# Patient Record
Sex: Female | Born: 1955
Health system: Southern US, Community
[De-identification: ages and names within clinical notes are randomized; demographics above are authoritative.]

## PROBLEM LIST (undated history)

## (undated) DIAGNOSIS — K219 Gastro-esophageal reflux disease without esophagitis: Secondary | ICD-10-CM

## (undated) DIAGNOSIS — M329 Systemic lupus erythematosus, unspecified: Secondary | ICD-10-CM

## (undated) DIAGNOSIS — G5600 Carpal tunnel syndrome, unspecified upper limb: Secondary | ICD-10-CM

## (undated) DIAGNOSIS — F419 Anxiety disorder, unspecified: Secondary | ICD-10-CM

## (undated) DIAGNOSIS — Z9889 Other specified postprocedural states: Secondary | ICD-10-CM

## (undated) DIAGNOSIS — R42 Dizziness and giddiness: Secondary | ICD-10-CM

## (undated) DIAGNOSIS — D649 Anemia, unspecified: Secondary | ICD-10-CM

## (undated) HISTORY — PX: OTHER SURGICAL HISTORY: SHX169

## (undated) HISTORY — PX: CHOLECYSTECTOMY: SHX55

## (undated) HISTORY — PX: ROTATOR CUFF REPAIR: SHX139

## (undated) HISTORY — PX: KNEE SURGERY: SHX244

## (undated) HISTORY — DX: Other specified postprocedural states: Z98.890

## (undated) HISTORY — PX: NISSEN FUNDOPLICATION: SHX2091

## (undated) HISTORY — DX: Gastro-esophageal reflux disease without esophagitis: K21.9

## (undated) HISTORY — PX: TONSILLECTOMY: SUR1361

## (undated) HISTORY — DX: Dizziness and giddiness: R42

## (undated) HISTORY — PX: NOSE SURGERY: SHX723

## (undated) HISTORY — DX: Carpal tunnel syndrome, unspecified upper limb: G56.00

## (undated) HISTORY — PX: ADENOIDECTOMY: SUR15

## (undated) HISTORY — DX: Anemia, unspecified: D64.9

---
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
## 1998-12-03 ENCOUNTER — Encounter: Payer: Self-pay | Admitting: Internal Medicine

## 1998-12-03 ENCOUNTER — Encounter: Admission: RE | Admit: 1998-12-03 | Discharge: 1998-12-03 | Payer: Self-pay | Admitting: Internal Medicine

## 1999-02-18 ENCOUNTER — Ambulatory Visit (HOSPITAL_BASED_OUTPATIENT_CLINIC_OR_DEPARTMENT_OTHER): Admission: RE | Admit: 1999-02-18 | Discharge: 1999-02-18 | Payer: Self-pay | Admitting: Orthopaedic Surgery

## 1999-06-25 ENCOUNTER — Encounter: Payer: Self-pay | Admitting: Internal Medicine

## 1999-06-25 ENCOUNTER — Encounter: Admission: RE | Admit: 1999-06-25 | Discharge: 1999-06-25 | Payer: Self-pay | Admitting: Internal Medicine

## 1999-10-22 ENCOUNTER — Encounter: Admission: RE | Admit: 1999-10-22 | Discharge: 1999-10-22 | Payer: Self-pay | Admitting: Family Medicine

## 1999-10-22 ENCOUNTER — Encounter: Payer: Self-pay | Admitting: Internal Medicine

## 1999-10-29 ENCOUNTER — Encounter (INDEPENDENT_AMBULATORY_CARE_PROVIDER_SITE_OTHER): Payer: Self-pay | Admitting: Specialist

## 1999-10-29 ENCOUNTER — Ambulatory Visit (HOSPITAL_COMMUNITY): Admission: RE | Admit: 1999-10-29 | Discharge: 1999-10-29 | Payer: Self-pay | Admitting: Gastroenterology

## 2000-03-16 ENCOUNTER — Encounter: Payer: Self-pay | Admitting: Surgery

## 2000-03-17 ENCOUNTER — Inpatient Hospital Stay (HOSPITAL_COMMUNITY): Admission: EM | Admit: 2000-03-17 | Discharge: 2000-03-18 | Payer: Self-pay | Admitting: Surgery

## 2001-08-12 ENCOUNTER — Ambulatory Visit (HOSPITAL_COMMUNITY): Admission: RE | Admit: 2001-08-12 | Discharge: 2001-08-12 | Payer: Self-pay | Admitting: Surgery

## 2001-08-12 ENCOUNTER — Encounter: Payer: Self-pay | Admitting: Surgery

## 2001-10-20 ENCOUNTER — Ambulatory Visit (HOSPITAL_COMMUNITY): Admission: RE | Admit: 2001-10-20 | Discharge: 2001-10-20 | Payer: Self-pay | Admitting: Gastroenterology

## 2002-03-22 ENCOUNTER — Emergency Department (HOSPITAL_COMMUNITY): Admission: EM | Admit: 2002-03-22 | Discharge: 2002-03-23 | Payer: Self-pay | Admitting: *Deleted

## 2002-03-22 ENCOUNTER — Encounter: Payer: Self-pay | Admitting: *Deleted

## 2002-03-23 ENCOUNTER — Encounter: Payer: Self-pay | Admitting: *Deleted

## 2002-05-25 ENCOUNTER — Encounter: Admission: RE | Admit: 2002-05-25 | Discharge: 2002-08-23 | Payer: Self-pay | Admitting: Internal Medicine

## 2002-06-05 ENCOUNTER — Encounter: Admission: RE | Admit: 2002-06-05 | Discharge: 2002-06-05 | Payer: Self-pay | Admitting: Internal Medicine

## 2002-06-05 ENCOUNTER — Encounter: Payer: Self-pay | Admitting: Internal Medicine

## 2003-04-24 ENCOUNTER — Ambulatory Visit (HOSPITAL_COMMUNITY): Admission: RE | Admit: 2003-04-24 | Discharge: 2003-04-24 | Payer: Self-pay | Admitting: Otolaryngology

## 2003-04-30 ENCOUNTER — Encounter: Admission: RE | Admit: 2003-04-30 | Discharge: 2003-04-30 | Payer: Self-pay | Admitting: Otolaryngology

## 2004-03-17 ENCOUNTER — Encounter: Admission: RE | Admit: 2004-03-17 | Discharge: 2004-03-17 | Payer: Self-pay | Admitting: Internal Medicine

## 2004-05-28 ENCOUNTER — Other Ambulatory Visit: Admission: RE | Admit: 2004-05-28 | Discharge: 2004-05-28 | Payer: Self-pay | Admitting: Internal Medicine

## 2006-07-06 ENCOUNTER — Encounter: Admission: RE | Admit: 2006-07-06 | Discharge: 2006-07-06 | Payer: Self-pay | Admitting: Family Medicine

## 2006-08-27 ENCOUNTER — Other Ambulatory Visit: Admission: RE | Admit: 2006-08-27 | Discharge: 2006-08-27 | Payer: Self-pay | Admitting: Family Medicine

## 2006-09-14 ENCOUNTER — Encounter: Admission: RE | Admit: 2006-09-14 | Discharge: 2006-09-14 | Payer: Self-pay | Admitting: Family Medicine

## 2006-09-17 ENCOUNTER — Encounter: Admission: RE | Admit: 2006-09-17 | Discharge: 2006-09-17 | Payer: Self-pay | Admitting: Family Medicine

## 2006-09-29 ENCOUNTER — Encounter: Admission: RE | Admit: 2006-09-29 | Discharge: 2006-09-29 | Payer: Self-pay | Admitting: Family Medicine

## 2006-10-14 ENCOUNTER — Encounter: Admission: RE | Admit: 2006-10-14 | Discharge: 2006-10-14 | Payer: Self-pay | Admitting: Family Medicine

## 2007-02-03 HISTORY — PX: OTHER SURGICAL HISTORY: SHX169

## 2007-03-30 ENCOUNTER — Encounter: Admission: RE | Admit: 2007-03-30 | Discharge: 2007-03-30 | Payer: Self-pay | Admitting: Family Medicine

## 2007-08-25 ENCOUNTER — Encounter (INDEPENDENT_AMBULATORY_CARE_PROVIDER_SITE_OTHER): Payer: Self-pay | Admitting: General Surgery

## 2007-08-25 ENCOUNTER — Ambulatory Visit (HOSPITAL_COMMUNITY): Admission: RE | Admit: 2007-08-25 | Discharge: 2007-08-25 | Payer: Self-pay | Admitting: General Surgery

## 2007-10-04 DIAGNOSIS — Z9889 Other specified postprocedural states: Secondary | ICD-10-CM

## 2007-10-04 HISTORY — DX: Other specified postprocedural states: Z98.890

## 2007-10-25 ENCOUNTER — Encounter (HOSPITAL_COMMUNITY): Admission: RE | Admit: 2007-10-25 | Discharge: 2008-01-23 | Payer: Self-pay | Admitting: Gastroenterology

## 2007-11-03 HISTORY — PX: GIVENS CAPSULE STUDY: SHX5432

## 2007-12-28 ENCOUNTER — Encounter: Admission: RE | Admit: 2007-12-28 | Discharge: 2007-12-28 | Payer: Self-pay | Admitting: Family Medicine

## 2008-02-16 ENCOUNTER — Ambulatory Visit (HOSPITAL_COMMUNITY): Admission: RE | Admit: 2008-02-16 | Discharge: 2008-02-16 | Payer: Self-pay | Admitting: General Surgery

## 2008-02-16 ENCOUNTER — Encounter (INDEPENDENT_AMBULATORY_CARE_PROVIDER_SITE_OTHER): Payer: Self-pay | Admitting: General Surgery

## 2008-04-24 ENCOUNTER — Encounter: Admission: RE | Admit: 2008-04-24 | Discharge: 2008-04-24 | Payer: Self-pay | Admitting: Family Medicine

## 2008-11-12 ENCOUNTER — Encounter: Admission: RE | Admit: 2008-11-12 | Discharge: 2008-11-12 | Payer: Self-pay | Admitting: Orthopedic Surgery

## 2009-02-02 ENCOUNTER — Emergency Department (HOSPITAL_COMMUNITY): Admission: EM | Admit: 2009-02-02 | Discharge: 2009-02-02 | Payer: Self-pay | Admitting: Emergency Medicine

## 2009-02-12 ENCOUNTER — Ambulatory Visit (HOSPITAL_BASED_OUTPATIENT_CLINIC_OR_DEPARTMENT_OTHER): Admission: RE | Admit: 2009-02-12 | Discharge: 2009-02-12 | Payer: Self-pay | Admitting: Orthopedic Surgery

## 2009-03-06 ENCOUNTER — Ambulatory Visit (HOSPITAL_BASED_OUTPATIENT_CLINIC_OR_DEPARTMENT_OTHER): Admission: RE | Admit: 2009-03-06 | Discharge: 2009-03-06 | Payer: Self-pay | Admitting: Orthopedic Surgery

## 2009-03-06 ENCOUNTER — Ambulatory Visit (HOSPITAL_COMMUNITY): Admission: RE | Admit: 2009-03-06 | Discharge: 2009-03-06 | Payer: Self-pay | Admitting: Family Medicine

## 2010-02-02 DIAGNOSIS — M329 Systemic lupus erythematosus, unspecified: Secondary | ICD-10-CM

## 2010-02-02 DIAGNOSIS — IMO0002 Reserved for concepts with insufficient information to code with codable children: Secondary | ICD-10-CM

## 2010-02-02 HISTORY — DX: Reserved for concepts with insufficient information to code with codable children: IMO0002

## 2010-02-02 HISTORY — DX: Systemic lupus erythematosus, unspecified: M32.9

## 2010-02-23 ENCOUNTER — Encounter: Payer: Self-pay | Admitting: Family Medicine

## 2010-02-23 ENCOUNTER — Encounter: Payer: Self-pay | Admitting: Otolaryngology

## 2010-04-20 LAB — BASIC METABOLIC PANEL
BUN: 9 mg/dL (ref 6–23)
CO2: 22 mEq/L (ref 19–32)
Calcium: 8.7 mg/dL (ref 8.4–10.5)
Chloride: 102 mEq/L (ref 96–112)
Creatinine, Ser: 0.5 mg/dL (ref 0.4–1.2)
GFR calc Af Amer: >60 mL/min (ref >60)
GFR calc non Af Amer: >60 mL/min (ref >60)
Glucose, Bld: 118 mg/dL — ABNORMAL HIGH (ref 70–99)
Potassium: 3.5 mEq/L (ref 3.5–5.1)
Sodium: 139 mEq/L (ref 135–145)

## 2010-04-20 LAB — POCT HEMOGLOBIN-HEMACUE: Hemoglobin: 13.3 g/dL (ref 12.0–15.0)

## 2010-05-19 LAB — COMPREHENSIVE METABOLIC PANEL
ALT: 28 U/L (ref 0–35)
AST: 23 U/L (ref 0–37)
Albumin: 3.7 g/dL (ref 3.5–5.2)
Alkaline Phosphatase: 55 U/L (ref 39–117)
BUN: 7 mg/dL (ref 6–23)
CO2: 26 mEq/L (ref 19–32)
Calcium: 9.7 mg/dL (ref 8.4–10.5)
Chloride: 106 mEq/L (ref 96–112)
Creatinine, Ser: 0.6 mg/dL (ref 0.4–1.2)
GFR calc Af Amer: >60 mL/min (ref >60)
GFR calc non Af Amer: >60 mL/min (ref >60)
Glucose, Bld: 96 mg/dL (ref 70–99)
Potassium: 4.2 mEq/L (ref 3.5–5.1)
Sodium: 139 mEq/L (ref 135–145)
Total Bilirubin: 0.5 mg/dL (ref 0.3–1.2)
Total Protein: 6.7 g/dL (ref 6.0–8.3)

## 2010-05-19 LAB — CBC
HCT: 40.3 % (ref 36.0–46.0)
Hemoglobin: 13.4 g/dL (ref 12.0–15.0)
MCHC: 33.2 g/dL (ref 30.0–36.0)
MCV: 89.9 fL (ref 78.0–100.0)
Platelets: 243 10*3/uL (ref 150–400)
RBC: 4.48 MIL/uL (ref 3.87–5.11)
RDW: 15.2 % (ref 11.5–15.5)
WBC: 6.6 10*3/uL (ref 4.0–10.5)

## 2010-05-19 LAB — DIFFERENTIAL
Basophils Absolute: 0 10*3/uL (ref 0.0–0.1)
Basophils Relative: 1 % (ref 0–1)
Eosinophils Absolute: 0.1 10*3/uL (ref 0.0–0.7)
Eosinophils Relative: 2 % (ref 0–5)
Lymphocytes Relative: 23 % (ref 12–46)
Lymphs Abs: 1.5 10*3/uL (ref 0.7–4.0)
Monocytes Absolute: 0.5 10*3/uL (ref 0.1–1.0)
Monocytes Relative: 7 % (ref 3–12)
Neutro Abs: 4.4 10*3/uL (ref 1.7–7.7)
Neutrophils Relative %: 67 % (ref 43–77)

## 2010-06-17 NOTE — Op Note (Signed)
NAMESHALA, BAUMBACH           ACCOUNT NO.:  [id number]   MEDICAL RECORD NO.:  [id number]          PATIENT TYPE:  AMB   LOCATION:  DAY                          FACILITY:  [facility]   PHYSICIAN:  Adolph Pollack, M.D.DATE OF BIRTH:  1955-04-30   DATE OF PROCEDURE:  08/25/2007  DATE OF DISCHARGE:                               OPERATIVE REPORT   PREOPERATIVE DIAGNOSIS:  Right sided headaches.   POSTOPERATIVE DIAGNOSIS:  Right sided headaches.   PROCEDURE:  Right superficial temporal artery biopsy.   SURGEON:  Adolph Pollack, M.D.   ANESTHESIA:  Local (lidocaine Marcaine mix) with MAC.   INDICATIONS:  This is a 55 year old female seen with some right-sided  headaches and visual changes.  There is a concern for temporal  arteritis.  She then placed on steroids with slight relief of symptoms.  She now presents for the above procedure.   TECHNIQUE:  She is seen in the holding area and the right forehead  marked with my initials.  She was then brought to the operating room,  placed supine on the operating table, given intravenous sedation.  The  hair around the right preauricular area up to the right forehead was  clipped and the area sterilely prepped and draped.  The artery was  palpable in the right preauricular area.  I anesthetized the skin in  that area beginning in the right preauricular area superiorly and then  heading toward the forehead.  I then made a skin incision through skin  and dermis.  Using careful dissection, I then identified the pulsating  right superficial temporal artery.  I used careful blunt dissection to  isolate it as well as one of its branches.  I then ligated it in three  areas proximally and distally as well as the distal portion of the  branch.  I then divided it sharply and sent the specimen off the field  to pathology for permanent section.   Following this I inspected the wound.  Hemostasis was adequate.  I then  closed the skin with a  running 4-0 Monocryl subcuticular stitch.  Dermabond and Steri-Strips applied.   She tolerated the procedure well without any apparent complications was  taken to recovery in satisfactory condition.      Adolph Pollack, M.D.  Electronically Signed     TJR/MEDQ  D:  08/25/2007  T:  08/25/2007  Job:  [id number]   cc:   Pramod P. Pearlean Brownie, MD  Fax: [phone]   Quita Skye. Artis Flock, M.D.  Fax: [phone]

## 2010-06-17 NOTE — Op Note (Signed)
[name], [name]           ACCOUNT NO.:  [id number]   MEDICAL RECORD NO.:  [id number]          PATIENT TYPE:  AMB   LOCATION:  SDS                          FACILITY:  [facility]   PHYSICIAN:  Adolph Pollack, M.D.DATE OF BIRTH:  [birth year]   DATE OF PROCEDURE:  02/16/2008  DATE OF DISCHARGE:  02/16/2008                               OPERATIVE REPORT   PREOPERATIVE DIAGNOSES:  1. Chronic cholecystitis.  2. Cholelithiasis.   POSTOPERATIVE DIAGNOSES:  1. Chronic cholecystitis.  2. Cholelithiasis.   PROCEDURE:  Laparoscopic cholecystectomy with intraoperative  cholangiogram.   SURGEON:  Adolph Pollack, MD   ASSISTANT:  Velora Heckler, MD   ANESTHESIA:  General.   INDICATIONS:  This 55 year old female had an acute episode of biliary  colic on December 28, 2007.  She was seen at Urgent Care and was  empirically given some ciprofloxacin.  She underwent radiologic study  demonstrating cholelithiasis but no cholecystitis.  She has been  relatively asymptomatic since that time.  She now presents for elective  cholecystectomy.  We have discussed the procedure, risks, and aftercare  preoperatively.   TECHNIQUE:  She was brought to the operating room, placed supine on the  operating table, and general anesthetic was administered.  The abdominal  wall was sterilely prepped and draped.  She had a previous Nissen  fundoplication by our laparoscopic approach.  Thus, a previous  supraumbilical incision was re-incised after infiltration of Marcaine  with a small incision made in the fascia and the peritoneal cavity  entered under direct vision.  A pursestring suture of 0 Vicryl was  placed around the fascial edges.  A Hasson trocar was introduced into  the peritoneal cavity and pneumoperitoneum created by insufflation of  CO2 gas.   Next, she was placed in reverse Trendelenburg position with right side  tilted slightly up.  An 11-mm trocar was then placed into the epigastric  region and two 5-mm trocars placed in the right upper quadrant.  The  fundus of the gallbladder was grasped and there noted to be omental  adhesions from the fundus all the way down to the infundibulum.  It was  taken down with electrocautery.  The fundus of the gallbladder was then  retracted toward the right shoulder.  The infundibulum was grasped and  retracted laterally.  Using blunt dissection, I mobilized the  infundibulum and identified the cystic duct and created a window around  it.  A clip was placed at the cystic duct and gallbladder junction and a  small incision made in the cystic duct.  A cholangiocath was passed into  the anterior abdominal wall, placed into the cystic duct, and a  cholangiogram was performed.   Using real-time fluoroscopy, dilute contrast was injected into the  cystic duct, which was of moderate length.  The common hepatic, right  and left hepatic, common bile ducts all filled promptly.  Contrast  drained promptly into the duodenum without obvious evidence of  obstruction.   Cholangiocatheter was removed, cystic duct was clipped 3 times  proximally and divided sharply.   I then identified both the anterior and posterior  branch of the cystic  artery.  Windows were created around these, they were clipped and  divided.  I divided the gallbladder and was dissected from the liver  intact using electrocautery and placed into an Endopouch bag.   I then irrigated out the gallbladder fossa.  Controlled bleeding points  at the gallbladder fossa with electrocautery.  There was also a small  amount of bleeding from the omentum that had been dissected free from  the gallbladder, and I controlled this with electrocautery.  Further  inspection demonstrated no further bleeding and the irrigation fluid was  evacuated.  Surgicel was then placed in the gallbladder fossa.   The gallbladder was removed through the subumbilical incision and the  subumbilical fascial  defect closed by tightening up and tying down the  pursestring suture.  The remaining trocars were removed, and  pneumoperitoneum was released.  The skin incisions were closed with 4-0  Monocryl subcuticular stitches followed by Steri-Strips and sterile  dressings.  She tolerated the procedure well without any apparent  complications, and she was taken to the recovery in satisfactory  condition.      Adolph Pollack, M.D.  Electronically Signed     TJR/MEDQ  D:  02/16/2008  T:  02/17/2008  Job:  [id number]   cc:   Jonita Albee, M.D.  Quita Skye Artis Flock, M.D.

## 2010-06-20 NOTE — Discharge Summary (Signed)
[facility]  Patient:    [name], [name]                  MRN: [id number] Adm. Date:  [id number] Attending:  Katha Cabal CC:         Darius Bump, M.D.  Charolett Bumpers III, M.D.   Discharge Summary  CCS NUMBER:  [id number]  ADMISSION DIAGNOSIS:  Refractory gastroesophageal reflux disease.  HOSPITAL COURSE:  [name] is a 55 year old lady who has had a time with gastroesophageal reflux disease.  She was taken to the operating room on March 16, 2000, and underwent a laparoscopic Nissen fundoplication (three) with closure of the hiatus (two) - one figure-of-eight suture and one simple suture and tacking of the wrap to the diaphragm.  Postoperatively she did well and was begun on liquids and advanced to a full liquid diet prior to discharge.  She was given Mepergan Fortis to take for pain if needed upon discharge.  She will followed up in the office in two to three weeks.  FINAL DIAGNOSIS:  Gastroesophageal reflux disease, status post laparoscopic Nissen fundoplication. DD:  03/18/00 TD:  03/18/00 Job: [id number]

## 2010-06-20 NOTE — Procedures (Signed)
[facility]  Patient:    [name], [name]                    MRN: [id number] Proc. Date: 10/29/99 Attending:  Verlin Grills, M.D.                           Procedure Report  PROCEDURE:  Esophagogastroduodenoscopy.  ENDOSCOPIST:  Verlin Grills, M.D.  INDICATIONS FOR PROCEDURE:  The patient is a 55 year old female with chronic gastroesophageal reflux disease who has controlled her heartburn with chronic proton pump inhibitor therapy.  She recently underwent an upper GI x-ray series which revealed spontaneous gastroesophageal reflux with an otherwise normal-appearing esophagus, sliding hiatal hernia, and a non-distensible narrowed gastric antrum.  Esophagogastroduodenoscopy is performed to evaluate the gastric antrum.  I discussed with the patient the complications associated with esophagogastroduodenoscopy including intestinal bleeding and intestinal perforation.  The patient has signed the operative permit.  PREMEDICATION:  Versed 7 mg and Demerol 50 mg.  ENDOSCOPE:  Olympus gastroscope.  DESCRIPTION OF PROCEDURE:  After obtaining informed consent, the patient was placed in the left lateral decubitus position.  I administered intravenous Demerol and intravenous Versed to achieve conscious sedation for the procedure.  The patients blood pressure, oxygen saturation, and cardiac rhythm were monitored throughout the procedure and documented in the medical record.  The Olympus gastroscope was passed through the posterior hypopharynx into the proximal esophagus without difficulty.  The hypopharynx, larynx, and vocal cords appeared normal.  Esophagoscopy:  The proximal, mid, and lower segments of the esophagus appeared normal.  The squamocolumnar junction and the esophagogastric junction are noted at 30 cm from the incisor teeth.  Endoscopically, there is no evidence of erosive esophagitis, Barretts esophagus, or esophageal  ulcers. There is no evidence of mucosal scarring of the esophagus.  Gastroscopy:  There is a moderate-sized hiatal hernia.  Retroflexed view of the gastric cardia and fundus was normal.  The gastric body appeared normal. Mucosa and the gastric antrum appeared normal.  The pylorus was patent.  There is circumferential narrowing of the distal gastric antrum and joins the pylorus.  The mucosa is intact.  There is no ulceration or otherwise abnormal endoscopic appearance.  Multiple biopsies were taken from the distal gastric antrum for pathologic evaluation to rule out H. pylori antral gastritis.  Duodenoscopy:  The duodenal bulb, mid duodenum, and distal duodenum appeared normal.  ASSESSMENT: 1. Gastroesophageal reflux disease associated with a moderate-sized hiatal    hernia, but otherwise normal-appearing esophagus. 2. Circumferential narrowing of the distal gastric antrum with normal    overlying mucosa with biopsies pending.  RECOMMENDATIONS:  Continue Nexium to control gastroesophageal reflux. Consider antireflux surgery if symptoms are not improved by proton pump inhibitor therapy. DD:  10/29/99 TD:  10/29/99 Job: [id number]

## 2010-06-20 NOTE — Op Note (Signed)
   NAME:  [name], [name]                     ACCOUNT NO.:  [id number]   MEDICAL RECORD NO.:  [id number]                   PATIENT TYPE:  AMB   LOCATION:  ENDO                                 FACILITY:  [facility]   PHYSICIAN:  Danise Edge, MD                  DATE OF BIRTH:  Nov 12, 1955   DATE OF PROCEDURE:  10/20/2001  DATE OF DISCHARGE:                                 OPERATIVE REPORT   PROCEDURE:  Esophagogastroduodenoscopy.   INDICATIONS:  The patient is a 55 year old female born 2055-04-15.  The patient  underwent a laparoscopic Nissen fundoplication to control chronic  gastroesophageal reflux a little over one year ago.  She has successfully  been weaned from proton pump inhibitor therapy and denies heartburn.   The patient is referred to me today for a diagnostic  esophagogastroduodenoscopy.  The patient feels discomfort in her hypopharynx  and also has bouts of atypical chest pain which is probably of esophageal  origin.   ENDOSCOPIST:  Danise Edge, M.D.   PREMEDICATION:  Versed 5 mg, fentanyl 50 mcg.   ENDOSCOPE:  Olympus gastroscope.   DESCRIPTION OF PROCEDURE:  After obtaining informed consent, the patient was  placed in the left lateral decubitus position.  I administered intravenous  Versed and intravenous fentanyl to achieve conscious sedation for the  procedure.  The patient's blood pressure, oxygen saturation, and cardiac  rhythm were monitored throughout the procedure and documented in the medical  record.   The Olympus gastroscope was passed through the posterior hypopharynx into  the proximal esophagus without difficulty.  The hypopharynx, larynx, and  vocal cords appeared completely normal.   Esophagoscopy:  The proximal, mid-, and lower segments of the esophageal  mucosa appear completely normal.   Gastroscopy:  There is a very small hiatal hernia.  Retroflexed view of the  gastric cardia and fundus was normal.  The diaphragmatic hiatus was not  patulous.  The gastric body, antrum, and pylorus appeared completely normal.   Duodenoscopy:  The duodenal bulb, mid-duodenum, and distal duodenum appeared  normal.   ASSESSMENT:  Normal esophagogastroduodenoscopy post laparoscopic Nissen  fundoplication.                                               Danise Edge, MD    MJ/MEDQ  D:  10/20/2001  T:  10/21/2001  Job:  [id number]   cc:   Darius Bump, M.D.   [place] Daphine Deutscher, M.D.  Fax: [phone]

## 2010-06-20 NOTE — Op Note (Signed)
[facility]  Patient:    [name], [name]                  MRN: [id number] Proc. Date: 03/16/00 Adm. Date:  [id number] Attending:  Katha Cabal CC:         Darius Bump, M.D.  Charolett Bumpers III, M.D.   Operative Report  CCS NUMBER [id number]  PREOPERATIVE DIAGNOSIS:  Gastroesophageal reflux disease.  POSTOPERATIVE DIAGNOSIS:  Gastroesophageal reflux disease with sliding hiatal hernia.  OPERATION:  Laparoscopic Nissen fundoplication (three) closure of the hiatus with two sutures including one figure-of-eight, tap wrap to diaphragm.  SURGEON:  [place]. Daphine Deutscher, M.D.  ASSISTANT:   Abigail Miyamoto, M.D.  OPERATIVE TIME:  2 hours 15 minutes.  FINDINGS:  Marked inflammation at the EG junction consistent with esophagitis.  DESCRIPTION OF PROCEDURE:  Ms. Hamm was taken to Room #1 and given general anesthesia.  The abdomen was prepped with Betadine and draped sterilely.  I made a longitudinal incision above the umbilicus and went through a pursestring suture and entered the abdomen without difficulty.  The abdomen was insufflated.  Two ports were subsequently placed on the right and one on the left and a 5 mm in the upper midline.  The liver was retracted.  We surveyed the stomach and did not see any extrinsic masses or any problems with that.  I was not able to look at her duodenum for duodenal diverticulum since it appeared to be in probably the third portion of the duodenum.  The stomach was grasped, and I used the harmonic to open the gastrohepatic window.  I dissected the right crus and was struck by the degree of inflammation in the EG junction region.  I was able to go ahead, however, and delineate the right crura and then the left crus.  There appeared to be a little sliding hiatal hernia sac on the left side.  I dissected beneath the esophagus but was not able to really get through at that point.  I then went over and  took down the short gastrics and took this up to the left crus.  I then went in the retroesophageal region and created a nice window.  A Penrose drain was placed around the EG junction and used to retract.  I was able to grasp the stomach, and I worked with that.  Initially, I was not satisfied that the cardia was free enough and went back and took down posteriorly some attachments and got a nice free area of cardiac with which I subsequently wrapped the EG junction. At this point, however, I began closing the hiatus.  I placed a figure-of-eight suture first posteriorly and tied this down.  Above that, a simple suture was placed, and this seemed to snug the esophagus nicely.  I reached around and grasped the stomach to wrap and found this to be very mobile.  I then passed the 56 lighted dilator and, with that in place, I sutured it to the esophagus with EndoStitch, doing an extracorporeal knot first.  This brought nice apposition.  A second tied intracorporeally was placed.  I then repaired a little deserosalization on an area of the stomach that I saw.  A third suture was then placed to complete the wrap.  The 56 dilator was then withdrawn without difficulty.  The wrap was tacked to the right crus with a single simple suture tied extracorporeally.  Clips had been placed on all of the knots.  A nice, secure wrap again anchored to the esophagus, and all purchases of the wrap had bites of esophagus as I placed them.  The port sites were injected with Marcaine.  They were inspected.  The pursestring was tied down under direct vision.  The abdomen was deflated, and the trocars were removed.  Skin was closed with 4-0 Vicryl, Benzoin, and Steri-Strips.  The patient seemed to tolerate the procedure well and was taken to the recovery room in satisfactory condition. DD:  03/16/00 TD:  03/17/00 Job: [id number]

## 2010-06-26 ENCOUNTER — Other Ambulatory Visit (HOSPITAL_COMMUNITY): Payer: Self-pay | Admitting: Internal Medicine

## 2010-06-26 DIAGNOSIS — Z139 Encounter for screening, unspecified: Secondary | ICD-10-CM

## 2010-07-08 ENCOUNTER — Ambulatory Visit (HOSPITAL_COMMUNITY): Payer: Self-pay

## 2010-10-31 LAB — CBC
HCT: 27.6 — ABNORMAL LOW
Hemoglobin: 9.3 — ABNORMAL LOW
MCHC: 33.5
MCV: 76.5 — ABNORMAL LOW
Platelets: 339
RBC: 3.61 — ABNORMAL LOW
RDW: 17.4 — ABNORMAL HIGH
WBC: 17.3 — ABNORMAL HIGH

## 2010-10-31 LAB — BASIC METABOLIC PANEL
BUN: 20
CO2: 25
Calcium: 9
Chloride: 103
Creatinine, Ser: 0.65
GFR calc Af Amer: >60
GFR calc non Af Amer: >60
Glucose, Bld: 113 — ABNORMAL HIGH
Potassium: 4.3
Sodium: 134 — ABNORMAL LOW

## 2010-10-31 LAB — DIFFERENTIAL
Basophils Absolute: 0.1
Basophils Relative: 1
Eosinophils Absolute: 0
Eosinophils Relative: 0
Lymphocytes Relative: 7 — ABNORMAL LOW
Lymphs Abs: 1.2
Monocytes Absolute: 0.8
Monocytes Relative: 5
Neutro Abs: 15.2 — ABNORMAL HIGH
Neutrophils Relative %: 87 — ABNORMAL HIGH

## 2010-10-31 LAB — PREGNANCY, URINE: Preg Test, Ur: NEGATIVE

## 2010-11-03 LAB — CROSSMATCH
ABO/RH(D): O POS
Antibody Screen: NEGATIVE

## 2010-11-03 LAB — ABO/RH: ABO/RH(D): O POS

## 2011-01-21 ENCOUNTER — Encounter: Payer: Self-pay | Admitting: Emergency Medicine

## 2011-01-21 ENCOUNTER — Emergency Department (HOSPITAL_COMMUNITY)
Admission: EM | Admit: 2011-01-21 | Discharge: 2011-01-22 | Disposition: A | Discharge status: Home / Self Care | Payer: Federal, State, Local not specified - PPO | Attending: Emergency Medicine | Admitting: Emergency Medicine

## 2011-01-21 DIAGNOSIS — S01119A Laceration without foreign body of unspecified eyelid and periocular area, initial encounter: Secondary | ICD-10-CM | POA: Insufficient documentation

## 2011-01-21 DIAGNOSIS — R296 Repeated falls: Secondary | ICD-10-CM | POA: Insufficient documentation

## 2011-01-21 DIAGNOSIS — S01112A Laceration without foreign body of left eyelid and periocular area, initial encounter: Secondary | ICD-10-CM

## 2011-01-21 MED ORDER — HYDROMORPHONE HCL PF 1 MG/ML IJ SOLN
1.0000 mg | Freq: Once | INTRAMUSCULAR | Status: AC
  Administered 2011-01-21: 1 mg via INTRAVENOUS
  Filled 2011-01-21: qty 1

## 2011-01-21 MED ORDER — OXYCODONE-ACETAMINOPHEN 5-325 MG PO TABS
1.0000 | ORAL_TABLET | Freq: Once | ORAL | Status: AC
  Administered 2011-01-21: 1 via ORAL
  Filled 2011-01-21: qty 1

## 2011-01-21 MED ORDER — SODIUM CHLORIDE 0.9 % IV BOLUS (SEPSIS)
500.0000 mL | Freq: Once | INTRAVENOUS | Status: AC
  Administered 2011-01-21: 500 mL via INTRAVENOUS

## 2011-01-21 MED ORDER — ONDANSETRON HCL 4 MG/2ML IJ SOLN
4.0000 mg | Freq: Once | INTRAMUSCULAR | Status: AC
  Administered 2011-01-21: 4 mg via INTRAVENOUS
  Filled 2011-01-21: qty 2

## 2011-01-21 NOTE — ED Notes (Signed)
Called [place], gave report to [facility]

## 2011-01-21 NOTE — ED Provider Notes (Addendum)
Patient was sent from any pain in the emergency room to [facility] cone emergency room for ophthalmology evaluation. Please see the note written by Dr. Jeraldine Loots for the complete evaluation. Patient sustained a laceration around her left eye. Dr. Vonna Kotyk this evaluated the patient here in the emergency room. He does not feel that this lacerations actually involves the lid margins. He recommends facial surgery repair. Physical Exam  BP 130/82  Pulse 74  Temp(Src) 98.3 F (36.8 C) (Oral)  Resp 18  Ht 5\' 3"  (1.6 m)  Wt 145 lb (65.772 kg)  BMI 25.69 kg/m2  SpO2 100%  Physical Exam Complex laceration around left eye inferior to the lid margin ED Course  Procedures  MDM Dr. Vonna Kotyk requests that I consult to facial trauma on-call.  I spoke with Dr Suszanne Conners who will come see the patient in the ED.  LAceration was repaired by Dr Suszanne Conners in the ED    Celene Kras, MD 01/21/11 1610  Celene Kras, MD 01/22/11 0110

## 2011-01-21 NOTE — ED Notes (Signed)
Pt has laceration/swelling/bruising to left eye from fall. Denies loc/neck pain. nad noted.

## 2011-01-21 NOTE — Consult Note (Signed)
55 yo h/o trauma with laceration under left eyelid.  Pt wears contacts and still has contacts in.  No eye pain and no decrease in vision.  Pain under left eye.  On examination pt was found to be 20/30 in both eyes.  Pt had contacts in.  Pt has trace injection of conjunctiva in the left eye and has a 4 1/2 cm laceration under left eye along cheek bone.  No eyelid involvement,  Pt does have prolapsed fat from wound and wound appears to involve subcutaneous tissue.  Pt has mild cataracts and retina is flat in both eyes.  No diplopia, eom full and PERRL. IOP equal bilaterally. This patient has trauma with no intraocular involvement.  Pt does have large laceration under left eye not involving eyelid.  ENT on call will evaluate and will most likely repair.  Pt to call if has any visual complaints.  My cellphone number is [phone] and my office number is [phone].  Thank you for the consult.

## 2011-01-21 NOTE — ED Notes (Signed)
MD at bedside. 

## 2011-01-21 NOTE — ED Notes (Signed)
Pt arrived via EMS from [facility] ER for opthalmic consult

## 2011-01-21 NOTE — ED Provider Notes (Signed)
History     CSN: [id number] Arrival date & time: 01/21/2011  7:20 PM   First MD Initiated Contact with Patient 01/21/11 1954      Chief Complaint  Patient presents with  . Laceration  . Fall    (Consider location/radiation/quality/duration/timing/severity/associated sxs/prior treatment) HPI The patient presents immediately following a fall. She describes a mechanical fall while outside doing yardwork. Since the fall she has had persistent pain focally about the left thigh. No loss of consciousness, no head pain, no visual changes, no nausea/vomiting, no ataxia, no other focal complaints. The pain has been persistent, not appreciably changed by anything, no attempts at OTC analgesia as far.   History reviewed. No pertinent past medical history.  Past Surgical History  Procedure Date  . Foot surgery   . Shoulder     No family history on file.  History  Substance Use Topics  . Smoking status: Never Smoker   . Smokeless tobacco: Not on file  . Alcohol Use: No    OB History    Grav Para Term Preterm Abortions TAB SAB Ect Mult Living                  Review of Systems  All other systems reviewed and are negative.    Allergies  Codeine and Pepto-bismol  Home Medications   Current Outpatient Rx  Name Route Sig Dispense Refill  . VITAMIN C PO Oral Take 1 tablet by mouth daily.      Marland Kitchen ESOMEPRAZOLE MAGNESIUM 40 MG PO CPDR Oral Take 40 mg by mouth daily before breakfast.      . GABAPENTIN 300 MG PO CAPS Oral Take 600 mg by mouth 2 (two) times daily.      Marland Kitchen RANITIDINE HCL PO Oral Take 1 tablet by mouth daily.        BP 114/66  Pulse 60  Temp 97.8 F (36.6 C)  Resp 18  Ht 5\' 3"  (1.6 m)  Wt 145 lb (65.772 kg)  BMI 25.69 kg/m2  SpO2 100%  Physical Exam  Constitutional: She is oriented to person, place, and time. She appears well-developed and well-nourished. No distress.  HENT:  Head: Normocephalic.    Eyes: Conjunctivae are normal. Pupils are equal,  round, and reactive to light. Right eye exhibits no discharge. Left eye exhibits no discharge.         There is an avulsion/laceration of the lower lid of the left eye, largely linear w medial/inferior deviation at the most medial edge.  Cardiovascular: Normal rate and regular rhythm.   Pulmonary/Chest: Effort normal. No stridor.  Abdominal: She exhibits no distension.  Musculoskeletal: She exhibits no edema.  Neurological: She is alert and oriented to person, place, and time. No cranial nerve deficit. Coordination normal.  Skin: Skin is warm and dry. She is not diaphoretic.  Psychiatric: She has a normal mood and affect.    ED Course  Procedures (including critical care time)  Labs Reviewed - No data to display No results found.   No diagnosis found.    MDM  This 65 roll female presents following a mechanical fall with left eyelid laceration. The extraocular muscles are intact, and the pupil is reactive to light without signs of globe trauma. The laceration is through the eyelid, beyond the scope of emergency department repair. The patient is otherwise stable, and will be transferred to [facility] for further evaluation and management. Dr. Vonna Kotyk is aware of the patient and will assist with the  emergency department staff upon the patient's arrival to that facility        Gerhard Munch, MD 01/21/11 2124

## 2011-01-22 MED ORDER — AMOXICILLIN-POT CLAVULANATE 875-125 MG PO TABS: 1.0000 | ORAL_TABLET | Freq: Two times a day (BID) | ORAL | Status: AC

## 2011-01-22 MED ORDER — GI COCKTAIL ~~LOC~~
30.0000 mL | Freq: Once | ORAL | Status: AC
  Administered 2011-01-22: 30 mL via ORAL
  Filled 2011-01-22: qty 30

## 2011-01-22 MED ORDER — HYDROCODONE-ACETAMINOPHEN 5-325 MG PO TABS: 1.0000 | ORAL_TABLET | Freq: Four times a day (QID) | ORAL | Status: AC | PRN

## 2011-01-22 NOTE — ED Notes (Signed)
Dr, Suszanne Conners at bedside for suturing.

## 2011-01-22 NOTE — Consult Note (Signed)
Reason for Consult: Complex left facial laceration  Referring Physician: Linwood Dibbles, MD  HPI:  [name] is an 55 y.o. female who presents to the ER today following a fall. Pt fell face first onto the ground while doing yard work. Pt c/o pain around left eye. No loss of consciousness, no significant headache, no visual changes, no nausea/vomiting, no ataxia, no other focal complaints. She was noted to have a complex 4cm flap laceration of the left lower eye lid. Fat herniation was noted. Ophtho eval performed by Dr. Vonna Kotyk.  No diplopia or visual loss.   History reviewed. No pertinent past medical history.  Past Surgical History  Procedure Date  . Foot surgery   . Shoulder     No family history on file.  Social History:  reports that she has never smoked. She does not have any smokeless tobacco history on file. She reports that she does not drink alcohol or use illicit drugs.   Allergies   Codeine and Pepto-bismol  Home Medications    Current Outpatient Rx   Name  Route  Sig  Dispense  Refill   .  VITAMIN C PO  Oral  Take 1 tablet by mouth daily.     Marland Kitchen  ESOMEPRAZOLE MAGNESIUM 40 MG PO CPDR  Oral  Take 40 mg by mouth daily before breakfast.     .  GABAPENTIN 300 MG PO CAPS  Oral  Take 600 mg by mouth 2 (two) times daily.     Marland Kitchen  RANITIDINE HCL PO  Oral  Take 1 tablet by mouth daily.      Review of Systems  As noted above. Pt o/w healthy. Other systems reviewed and are negative.   Blood pressure 130/82, pulse 74, temperature 98.3 F (36.8 C), temperature source Oral, resp. rate 18, height 5\' 3"  (1.6 m), weight 65.772 kg (145 lb), SpO2 100.00%.  Physical Exam  Constitutional: She is oriented to person, place, and time. She appears well-developed and well-nourished. No distress.  HENT:  Head: Normocephalic.  Eyes: Conjunctivae are normal. Pupils are equal, round, and reactive to light. Right eye exhibits no discharge. Left eye exhibits no discharge.  There is a 4cm  avulsion/laceration of the lower lid of the left eye. A skin flap was created from the trauma, exposing the orbital fat. Her pupils are equal, round, reactive to light. Extraocular motion is intact. Examination of the ears shows normal auricles and external auditory canals bilaterally. No middle ear effusion or hemotympanum is noted. Nasal examination shows normal mucosa, septum, turbinates. Facial examination shows no asymmetry. Palpation of the face elicits tenderness around the left periorbital area. Oral cavity examination shows no mucosal lacerations. No significant trismus is noted. Palpation of the neck reveals no lymphadenopathy or mass. The trachea is midline. The thyroid is not significantly enlarged. Cranial nerves 2-12 are all grossly in tact. Musculoskeletal: She exhibits no edema.  Neurological: She is alert and oriented to person, place, and time. No cranial nerve deficit. Coordination normal.  Skin: Skin is warm and dry. She is not diaphoretic.  Psychiatric: She has a normal mood and affect.   Procedure: Complex repair of 4cm left lower eyelid laceration Anesthesia: Local anesthesia with 1% lidocaine with 1:100,000 epinephrine Description: The patient is placed supine on the operating table. The left midface is prepped and draped in a sterile fashion.  After adequate local anesthesia is achieved, the laceration site is carefully debrided. The wound site is copiously irrigated. The herniated fat is  removed. Extensive soft tissue undermining is performed to release the skin tension.  The laceration is closed in layers with interrupted sutures.  The patient tolerated the procedure well.    Assessment/Plan: Complex left lower eyelid laceration  - Repair of the laceration under local anesthesia in ER - Augmentin./ pain meds - Pt to f/u in my office for suture removal (01/28/11 at 1pm)  Allyne Hebert,SUI W 01/22/2011, 1:11 AM

## 2011-01-22 NOTE — ED Notes (Signed)
Sutures intact with minimal seepage from wound

## 2011-02-10 ENCOUNTER — Other Ambulatory Visit: Payer: Self-pay

## 2011-02-10 NOTE — Progress Notes (Signed)
Error

## 2011-02-11 ENCOUNTER — Ambulatory Visit (INDEPENDENT_AMBULATORY_CARE_PROVIDER_SITE_OTHER): Payer: Federal, State, Local not specified - PPO | Admitting: Gastroenterology

## 2011-02-11 ENCOUNTER — Encounter: Payer: Self-pay | Admitting: Gastroenterology

## 2011-02-11 VITALS — BP 128/78 | HR 73 | Temp 98.2°F | Ht 64.0 in | Wt 156.0 lb

## 2011-02-11 DIAGNOSIS — R109 Unspecified abdominal pain: Secondary | ICD-10-CM

## 2011-02-11 DIAGNOSIS — K219 Gastro-esophageal reflux disease without esophagitis: Secondary | ICD-10-CM

## 2011-02-11 DIAGNOSIS — D649 Anemia, unspecified: Secondary | ICD-10-CM

## 2011-02-11 NOTE — Patient Instructions (Signed)
We have given you blood work to complete. We also have set you up for a CT scan due to your abdominal pain. We will be calling you with the results.  We have also requested prior reports from colonoscopy and endoscopy. Please follow the high fiber diet and reflux diet.   We will be in touch with the results of these tests and further work-up if needed. In the meantime, continue taking Nexium daily, 30 minutes before the first meal of the day. Follow a low-fat diet and avoid eating late at night.  Please make an appointment to see your gynecologist as soon as possible to evaluate the bleeding.

## 2011-02-11 NOTE — Progress Notes (Signed)
Referring Provider: Terie Purser, PA Primary Care Physician:  Cassell Smiles., MD, MD Primary Gastroenterologist:  Dr. Jena Gauss  Chief Complaint  Patient presents with  . Gastrophageal Reflux    HPI:   [name] is a 56 year old female who presents at the request of Terie Purser, [place], secondary to reflux. She is quite talkative, discussing her past history of bursitis, hamstring issues. She has a history of IDA and underwent an EGD and colonoscopy by Dr. Elnoria Howard in Sept 2009. This showed a 5 cm hiatal hernia. Colonoscopy showed normal colon, large hemorrhoids. Apparently, a capsule study was ordered, but I do not know if patient completed this.  She has a hx of chronic reflux, underwent Nissen in the early 2000s. She had one severe episode where she had shooting pains across left side of her chest, radiated to her back. Also felt pain in lower belly upward to left side of chest. This has not happened since. Denies any SOB. Monitors what she eats and normally does well without severe reflux symptoms. Denies any dysphagia.   However, she reports RLQ pain wrapping around to right flank since Oct/Nov. Intermittent, mainly when laying down at night. After standing for awhile it will go away. Denies any constipation or diarrhea. Denies any melena or hematochezia.   Has not had a period in over one year, but she did start spotting a few days ago.    Past Medical History  Diagnosis Date  . GERD (gastroesophageal reflux disease)   . Anemia     reportedly IDA  . S/P endoscopy Sept 2009    Dr. Elnoria Howard: hiatal hernia  . S/P colonoscopy Sept 2009    normal colon, large hemorrhoids    Past Surgical History  Procedure Date  . Bilateral foot surgery   . Rotator cuff repair   . Cholecystectomy   . Tonsillectomy   . Adenoidectomy   . Nose surgery   . Nissen fundoplication     Current Outpatient Prescriptions  Medication Sig Dispense Refill  . Ascorbic Acid (VITAMIN C PO) Take 1 tablet by  mouth daily.        Marland Kitchen esomeprazole (NEXIUM) 40 MG capsule Take 40 mg by mouth daily before breakfast.        . gabapentin (NEURONTIN) 300 MG capsule Take 600 mg by mouth 2 (two) times daily.        . traZODone (DESYREL) 50 MG tablet Take 50 mg by mouth 2 (two) times daily.       . indomethacin (INDOCIN) 50 MG capsule Take 50 mg by mouth 2 (two) times daily with a meal.        . RANITIDINE HCL PO Take 300 mg by mouth daily.         Allergies as of 02/11/2011 - Review Complete 02/11/2011  Allergen Reaction Noted  . Codeine Nausea And Vomiting 01/21/2011  . Pepto-bismol  01/21/2011    Family History  Problem Relation Age of Onset  . Colon cancer Neg Hx   . Breast cancer Sister     History   Social History  . Marital Status: Married    Spouse Name: N/A    Number of Children: 0  . Years of Education: N/A   Occupational History  .  [employer]   Social History Main Topics  . Smoking status: Never Smoker   . Smokeless tobacco: Not on file  . Alcohol Use: No  . Drug Use: No  . Sexually Active: Not on file  Other Topics Concern  . Not on file   Social History Narrative  . No narrative on file    Review of Systems: Gen: Denies any fever, chills, loss of appetite, fatigue, weight loss. CV: Denies chest pain, heart palpitations, syncope, peripheral edema. Resp: Denies shortness of breath with rest, cough, wheezing GI: Denies dysphagia or odynophagia. Denies hematemesis, fecal incontinence, or jaundice.  GU : Denies urinary burning, urinary frequency, urinary incontinence.  MS: Denies joint pain, muscle weakness, cramps, limited movement Derm: Denies rash, itching, dry skin Psych: Denies depression, anxiety, confusion or memory loss  Heme: Denies bruising, bleeding, and enlarged lymph nodes.  Physical Exam: BP 128/78  Pulse 73  Temp(Src) 98.2 F (36.8 C) (Temporal)  Ht 5\' 4"  (1.626 m)  Wt 156 lb (70.761 kg)  BMI 26.78 kg/m2 General:   Alert and oriented.  Well-developed, well-nourished, pleasant and cooperative. Head:  Normocephalic and atraumatic. Eyes:  Conjunctiva pink, sclera clear, no icterus.   Conjunctiva pink. Ears:  Normal auditory acuity. Nose:  No deformity, discharge,  or lesions. Mouth:  No deformity or lesions, mucosa pink and moist.  Neck:  Supple, without mass or thyromegaly. Lungs:  Clear to auscultation bilaterally, without wheezing, rales, or rhonchi.  Heart:  S1, S2 present without murmurs noted.  Abdomen:  +BS, soft, non-tender and non-distended. Without mass or HSM. No rebound or guarding. No hernias noted. Notes an "awareness" in LLQ but no pain.  Rectal:  Deferred  Msk:  Symmetrical without gross deformities. Normal posture. Pulses:  Normal pulses noted. Extremities:  Without clubbing or edema. Neurologic:  Alert and  oriented x4;  grossly normal neurologically. Skin:  Intact, warm and dry without significant lesions or rashes Cervical Nodes:  No significant cervical adenopathy. Psych:  Alert and cooperative. Normal mood and affect.

## 2011-02-12 ENCOUNTER — Encounter: Payer: Self-pay | Admitting: Gastroenterology

## 2011-02-12 DIAGNOSIS — D649 Anemia, unspecified: Secondary | ICD-10-CM | POA: Insufficient documentation

## 2011-02-12 DIAGNOSIS — R109 Unspecified abdominal pain: Secondary | ICD-10-CM | POA: Insufficient documentation

## 2011-02-12 DIAGNOSIS — K219 Gastro-esophageal reflux disease without esophagitis: Secondary | ICD-10-CM | POA: Insufficient documentation

## 2011-02-12 LAB — CBC WITH DIFFERENTIAL/PLATELET
Basophils Absolute: 0 10*3/uL (ref 0.0–0.1)
Basophils Relative: 1 % (ref 0–1)
Eosinophils Absolute: 0.2 10*3/uL (ref 0.0–0.7)
Eosinophils Relative: 3 % (ref 0–5)
HCT: 38.5 % (ref 36.0–46.0)
Hemoglobin: 12.4 g/dL (ref 12.0–15.0)
Lymphocytes Relative: 28 % (ref 12–46)
Lymphs Abs: 1.9 10*3/uL (ref 0.7–4.0)
MCH: 30 pg (ref 26.0–34.0)
MCHC: 32.2 g/dL (ref 30.0–36.0)
MCV: 93 fL (ref 78.0–100.0)
Monocytes Absolute: 0.5 10*3/uL (ref 0.1–1.0)
Monocytes Relative: 7 % (ref 3–12)
Neutro Abs: 4.2 10*3/uL (ref 1.7–7.7)
Neutrophils Relative %: 61 % (ref 43–77)
Platelets: 256 10*3/uL (ref 150–400)
RBC: 4.14 MIL/uL (ref 3.87–5.11)
RDW: 15.4 % (ref 11.5–15.5)
WBC: 6.9 10*3/uL (ref 4.0–10.5)

## 2011-02-12 LAB — CREATININE, SERUM: Creat: 0.5 mg/dL (ref 0.50–1.10)

## 2011-02-12 NOTE — Assessment & Plan Note (Signed)
Hx of IDA with work-up to include EGD and TCS in 2009. Need to retreive capsule report if this was done. Check CBC for records.   3 mos f/u

## 2011-02-12 NOTE — Assessment & Plan Note (Addendum)
56 year old with chronic GERD, hx of Nissen in past. Last EGD in 2009 without significant findings. Apparently has hx of IDA and underwent colonoscopy as well, question if capsule study performed as recommended. Will need to retrieve reports if so. No dysphagia, nausea. Likely reflux related to dietary habits, reported weight gain. Episode of left-sided chest pain concerning; informed to present to ED if further episodes. Hold off on upper GI tract evaluation at this point. Follow low-fat/GERD diet, continue Nexium daily. Appears reflux fairly well-controlled when following appropriate diet.   Nexium daily GERD diet Obtain capsule report if one exists 3 mos f/u

## 2011-02-12 NOTE — Progress Notes (Signed)
   Can we see if a capsule study exists performed by Dr. Elnoria Howard in 2009? Thanks! Also, need f/u in 3 months. Thanks!

## 2011-02-12 NOTE — Assessment & Plan Note (Signed)
Intermittent RLQ pain wrapping around to right lower back since Oct/Nov this year. No changes in bowel habits, melena, hematochezia. Did note new onset of vaginal spotting. Physical exam overall benign. 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 However, will assess with CT scan. Colonoscopy fairly recent as of 2009 with no significant findings. Pt informed to make appt with GYN ASAP. Offered to set this up for her; she declined.   High fiber diet CT abd/pelvis, Creatinine prior

## 2011-02-13 NOTE — Progress Notes (Signed)
Reminder in epic to follow up in 3 months °

## 2011-02-16 NOTE — Progress Notes (Signed)
Faxed to PCP

## 2011-02-16 NOTE — Progress Notes (Signed)
requested

## 2011-02-17 NOTE — Progress Notes (Signed)
Quick Note:  Normal. CT ordered. When is this scheduled, or did she miss it? ______

## 2011-02-18 NOTE — Progress Notes (Signed)
Quick Note:  Pt aware, ct is scheduled for tomorrow- 02/19/11 ______

## 2011-02-19 ENCOUNTER — Ambulatory Visit (HOSPITAL_COMMUNITY)
Admission: RE | Admit: 2011-02-19 | Discharge: 2011-02-19 | Disposition: A | Discharge status: Home / Self Care | Payer: Federal, State, Local not specified - PPO | Source: Ambulatory Visit | Attending: Gastroenterology | Admitting: Gastroenterology

## 2011-02-19 DIAGNOSIS — R9389 Abnormal findings on diagnostic imaging of other specified body structures: Secondary | ICD-10-CM | POA: Insufficient documentation

## 2011-02-19 DIAGNOSIS — R1031 Right lower quadrant pain: Secondary | ICD-10-CM | POA: Insufficient documentation

## 2011-02-19 MED ORDER — IOHEXOL 300 MG/ML  SOLN
100.0000 mL | Freq: Once | INTRAMUSCULAR | Status: AC | PRN
  Administered 2011-02-19: 100 mL via INTRAVENOUS

## 2011-03-02 NOTE — Progress Notes (Signed)
Quick Note:  Possible uterine fibroid. Pt needs to make sure she has an appt with GYN or has already seen GYN due to spotting noted at last visit. I need to know if she ever had capsule study completed by Dr. Elnoria Howard? ______

## 2011-03-05 NOTE — Progress Notes (Signed)
Requested.

## 2011-03-09 NOTE — Progress Notes (Signed)
Quick Note:  Thanks. ______ 

## 2011-03-12 ENCOUNTER — Other Ambulatory Visit: Payer: Self-pay | Admitting: Obstetrics and Gynecology

## 2011-03-12 DIAGNOSIS — R928 Other abnormal and inconclusive findings on diagnostic imaging of breast: Secondary | ICD-10-CM

## 2011-03-19 ENCOUNTER — Ambulatory Visit
Admission: RE | Admit: 2011-03-19 | Discharge: 2011-03-19 | Disposition: A | Discharge status: Home / Self Care | Payer: Federal, State, Local not specified - PPO | Source: Ambulatory Visit | Attending: Obstetrics and Gynecology | Admitting: Obstetrics and Gynecology

## 2011-03-19 DIAGNOSIS — R928 Other abnormal and inconclusive findings on diagnostic imaging of breast: Secondary | ICD-10-CM

## 2011-05-05 ENCOUNTER — Encounter: Payer: Self-pay | Admitting: Gastroenterology

## 2011-06-02 ENCOUNTER — Encounter: Payer: Self-pay | Admitting: Gastroenterology

## 2011-06-02 NOTE — Progress Notes (Signed)
GIVENS from Oct 2009 normal. Keep appt as planned. Report to be scanned.

## 2011-06-02 NOTE — Progress Notes (Signed)
Capsule study reviewed. No abnormalities.  Performed by Dr. Elnoria Howard Oct 2009

## 2013-07-17 ENCOUNTER — Encounter (HOSPITAL_COMMUNITY): Payer: Self-pay | Admitting: Emergency Medicine

## 2013-07-17 ENCOUNTER — Emergency Department (HOSPITAL_COMMUNITY)
Admission: EM | Admit: 2013-07-17 | Discharge: 2013-07-17 | Disposition: A | Discharge status: Home / Self Care | Payer: Federal, State, Local not specified - PPO | Attending: Emergency Medicine | Admitting: Emergency Medicine

## 2013-07-17 DIAGNOSIS — IMO0002 Reserved for concepts with insufficient information to code with codable children: Secondary | ICD-10-CM | POA: Insufficient documentation

## 2013-07-17 DIAGNOSIS — L02411 Cutaneous abscess of right axilla: Secondary | ICD-10-CM

## 2013-07-17 DIAGNOSIS — Z79899 Other long term (current) drug therapy: Secondary | ICD-10-CM | POA: Insufficient documentation

## 2013-07-17 DIAGNOSIS — K219 Gastro-esophageal reflux disease without esophagitis: Secondary | ICD-10-CM | POA: Insufficient documentation

## 2013-07-17 DIAGNOSIS — Z791 Long term (current) use of non-steroidal anti-inflammatories (NSAID): Secondary | ICD-10-CM | POA: Insufficient documentation

## 2013-07-17 DIAGNOSIS — Z862 Personal history of diseases of the blood and blood-forming organs and certain disorders involving the immune mechanism: Secondary | ICD-10-CM | POA: Insufficient documentation

## 2013-07-17 MED ORDER — LIDOCAINE HCL (PF) 1 % IJ SOLN
5.0000 mL | Freq: Once | INTRAMUSCULAR | Status: AC
  Administered 2013-07-17: 5 mL via INTRADERMAL
  Filled 2013-07-17: qty 5

## 2013-07-17 MED ORDER — SULFAMETHOXAZOLE-TMP DS 800-160 MG PO TABS: 1.0000 | ORAL_TABLET | Freq: Two times a day (BID) | ORAL | Status: DC

## 2013-07-17 MED ORDER — HYDROCODONE-ACETAMINOPHEN 5-325 MG PO TABS: ORAL_TABLET | ORAL | Status: DC

## 2013-07-17 NOTE — ED Notes (Signed)
Pt has red swollen area rt axilla. Thinks it was a "ruptured blood vessel", Says it is getting smaller in size , but cont to be painful.  No d/c

## 2013-07-17 NOTE — Discharge Instructions (Signed)
Abscess  Care After  An abscess (also called a boil or furuncle) is an infected area that contains a collection of pus. Signs and symptoms of an abscess include pain, tenderness, redness, or hardness, or you may feel a moveable soft area under your skin. An abscess can occur anywhere in the body. The infection may spread to surrounding tissues causing cellulitis. A cut (incision) by the surgeon was made over your abscess and the pus was drained out. Gauze may have been packed into the space to provide a drain that will allow the cavity to heal from the inside outwards. The boil may be painful for 5 to 7 days. Most people with a boil do not have high fevers. Your abscess, if seen early, may not have localized, and may not have been lanced. If not, another appointment may be required for this if it does not get better on its own or with medications.  HOME CARE INSTRUCTIONS   · Only take over-the-counter or prescription medicines for pain, discomfort, or fever as directed by your caregiver.  · When you bathe, soak and then remove gauze or iodoform packs at least daily or as directed by your caregiver. You may then wash the wound gently with mild soapy water. Repack with gauze or do as your caregiver directs.  SEEK IMMEDIATE MEDICAL CARE IF:   · You develop increased pain, swelling, redness, drainage, or bleeding in the wound site.  · You develop signs of generalized infection including muscle aches, chills, fever, or a general ill feeling.  · An oral temperature above 102° F (38.9° C) develops, not controlled by medication.  See your caregiver for a recheck if you develop any of the symptoms described above. If medications (antibiotics) were prescribed, take them as directed.  Document Released: 08/07/2004 Document Revised: 04/13/2011 Document Reviewed: 04/04/2007  Exit[employer]® Patient Information ©2014 [employer].

## 2013-07-17 NOTE — ED Provider Notes (Signed)
CSN: [id number]     Arrival date & time 07/17/13  1020 History  This chart was scribed for non-physician practitioner, [name] working with Amanda LennertJoseph L Zammit, MD by [name], ED scribe. This patient was seen in room APFT23/APFT23 and the patient's care was started at 12:22 PM.    Chief Complaint  Patient presents with  . Abscess   Patient is a 58 y.o. female presenting with abscess. The history is provided by the patient. No language interpreter was used.  Abscess Location:  Shoulder/arm Shoulder/arm abscess location:  R axilla Abscess quality: painful and redness   Abscess quality: not draining   Progression:  Worsening Pain details:    Quality:  Aching   Duration:  1 week Context: not diabetes   Associated symptoms: no fever, no nausea and no vomiting    HPI Comments: [name] is a 58 y.o. female who presents to the Emergency Department complaining of an abscess that started approximately 1 week ago. Pt states that last week she had a "blood vessel" rupture under her  right arm. She states that usually when she experiences a "blood vessel" rupture she feels a burning sensation, however this time she noticed the abscess underneath her arm. She states at the beginning of her week the abscess was bigger than what it is currently. Pt denied any bloody discharge, fever, chills, neck pain or red streaking. She denies any exposures to new Deoderant or body sprays. [name] states that she's been taking Excedrin without relief. Pt states that she bruises easily. She denies any history of prior abscesses. She denies any history of DM.   Past Medical History  Diagnosis Date  . GERD (gastroesophageal reflux disease)   . Anemia     reportedly IDA; GIVENS performed by Dr. Elnoria Chapman Oct 2009, no abnormalities  . S/P endoscopy Sept 2009    Dr. Elnoria Chapman: hiatal hernia  . S/P colonoscopy Sept 2009    normal colon, large hemorrhoids   Past Surgical History  Procedure Laterality  Date  . Bilateral foot surgery    . Rotator cuff repair    . Cholecystectomy    . Tonsillectomy    . Adenoidectomy    . Nose surgery    . Nissen fundoplication    . Givens capsule study  Oct 2009    Dr. Elnoria Chapman: normal   Family History  Problem Relation Age of Onset  . Colon cancer Neg Hx   . Breast cancer Sister    History  Substance Use Topics  . Smoking status: Never Smoker   . Smokeless tobacco: Not on file  . Alcohol Use: No   OB History   Grav Para Term Preterm Abortions TAB SAB Ect Mult Living                 Review of Systems  Constitutional: Negative for fever and chills.  HENT: Negative for congestion.   Respiratory: Negative for cough.   Gastrointestinal: Negative for nausea and vomiting.  Musculoskeletal: Positive for myalgias. Negative for arthralgias and joint swelling.  Skin: Positive for color change and wound (abscess).       Abscess   Hematological: Negative for adenopathy.  All other systems reviewed and are negative.  Allergies  Bismuth subsalicylate and Codeine  Home Medications   Prior to Admission medications   Medication Sig Start Date End Date Taking? Authorizing Provider  Ascorbic Acid (VITAMIN C PO) Take 1 tablet by mouth daily.  Historical Provider, MD  esomeprazole (NEXIUM) 40 MG capsule Take 40 mg by mouth daily before breakfast.      Historical Provider, MD  gabapentin (NEURONTIN) 300 MG capsule Take 600 mg by mouth 2 (two) times daily.      Historical Provider, MD  indomethacin (INDOCIN) 50 MG capsule Take 50 mg by mouth 2 (two) times daily with a meal.      Historical Provider, MD  RANITIDINE HCL PO Take 300 mg by mouth daily.     Historical Provider, MD  traZODone (DESYREL) 50 MG tablet Take 50 mg by mouth 2 (two) times daily.  01/20/11   Historical Provider, MD   Triage vitals: BP 132/85  Pulse 83  Temp(Src) 97.9 F (36.6 C) (Oral)  SpO2 97%  Physical Exam  Nursing note and vitals reviewed. Constitutional: She is oriented  to person, place, and time. She appears well-developed and well-nourished. No distress.  HENT:  Head: Normocephalic and atraumatic.  Eyes: Conjunctivae and EOM are normal.  Neck: Normal range of motion.  Cardiovascular: Normal rate.   Pulmonary/Chest: Effort normal. No respiratory distress.  Musculoskeletal: Normal range of motion.  Neurological: She is alert and oriented to person, place, and time.  Skin: Skin is warm and dry.  3 cm area of induration and erythema to there right axilla. No lymphangitis. No drainage. No significant fluctuance.    Psychiatric: She has a normal mood and affect. Her behavior is normal.    ED Course  Procedures (including critical care time)  DIAGNOSTIC STUDIES: Oxygen Saturation is 97% on RA, adequate by my interpretation.    COORDINATION OF CARE: 12:30 PM- Pt advised of plan for treatment and pt agrees.  INCISION AND DRAINAGE  Performed by: Amanda Thain L. Marlane Hirschmann, PA-C Authorized by: Amanda Lorusso L. Tziporah Knoke, PA-C  Consent - Verbal Consent obtained Risks and benefits: risks/benefits and alternatives were discussed  Type: Abscess  Body Area: Right axilla  Anesthesia: Local infiltration Local anesthetic: lidocaine 1%; No epinephrine  Anesthetic total: 2.0 ml  Complexity: Complex  Blunt dissection to break up loculations  Drainage: Purulent  Drainage amount: small  Packing material: 1/4 iodoform gauze  Patient tolerance: Patient tolerated the procedure well with no immediate complications   MDM   Final diagnoses:  Abscess of axilla, right    Pt agrees to packing removal in 2 days or to return here if complications.  rx for vicodin and bactrim  I personally performed the services described in this documentation, which was scribed in my presence. The recorded information has been reviewed and is accurate.    Amanda Koppel L. Trisha Mangleriplett, PA-C 07/19/13 2230

## 2013-07-17 NOTE — ED Notes (Signed)
Pt reports last week had a blood vessel rupture under r arm.  Reports now area is red, swollen, and tender.

## 2013-07-21 NOTE — ED Provider Notes (Signed)
Medical screening examination/treatment/procedure(s) were performed by non-physician practitioner and as supervising physician I was immediately available for consultation/collaboration.   EKG Interpretation None        Lilygrace Rodick L Hadlei Stitt, MD 07/21/13 1547 

## 2013-09-02 IMAGING — CT CT ABD-PELV W/ CM
2 of 5 series · 16 of 46 positions shown, 18 images · IV contrast (Omnipaque 300)
Comparison: 12/28/2007

CLINICAL DATA: Right lower quadrant pain radiating to right flank
for 2 to 3 months, past history of cholecystectomy, Nissen
fundoplication

CT ABDOMEN AND PELVIS WITH CONTRAST
TECHNIQUE: Multidetector CT imaging of the abdomen and pelvis was
performed following the standard protocol during bolus
administration of intravenous contrast. Sagittal and coronal MPR
images reconstructed from axial data set.
Contrast: 100mL OMNIPAQUE IOHEXOL 300 MG/ML IV SOLN. Dilute oral
contrast.

[Series 2: abd_pel_with 5.0 b40f · axial · 0.73mm/px · z∈[+462,+842]mm · 13 of 88 slices shown, 15 images]
[im 6/88  soft-tissue]
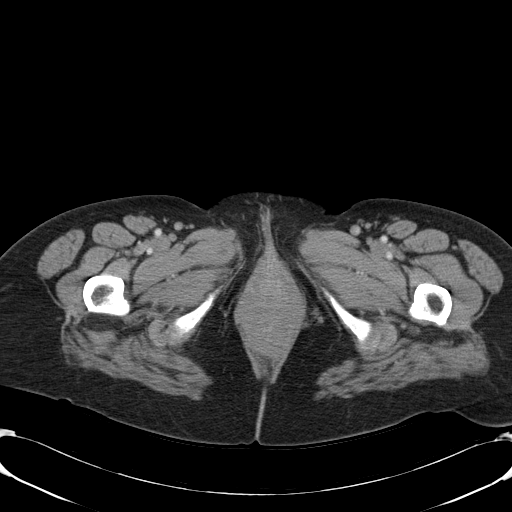
[im 6/88  bone]
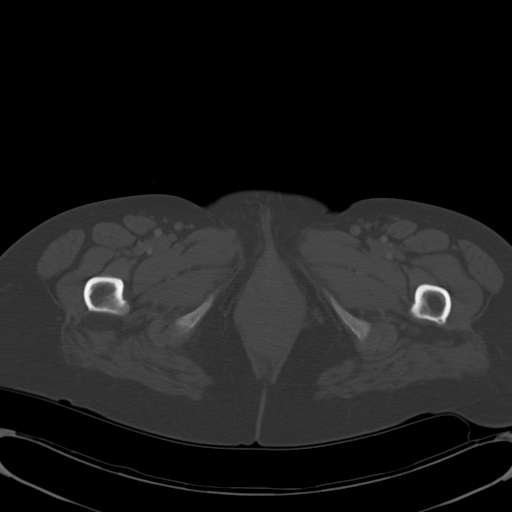
[im 11/88  soft-tissue]
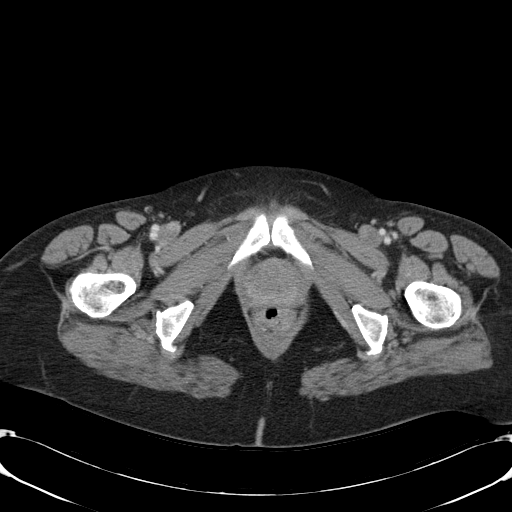
[im 21/88  soft-tissue]
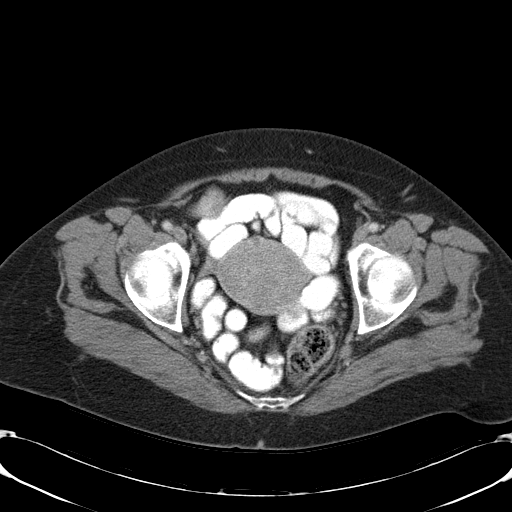
[im 26/88  soft-tissue]
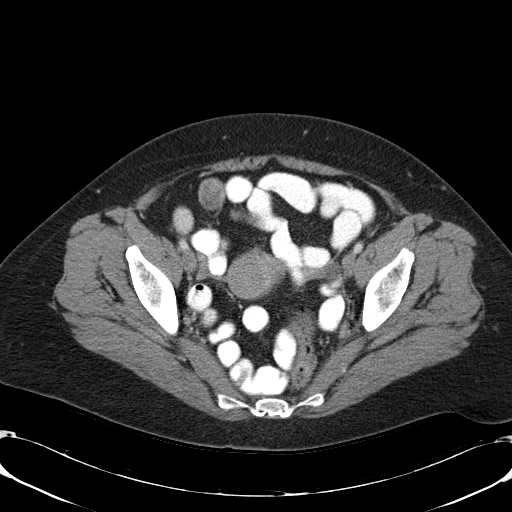
[im 31/88  soft-tissue]
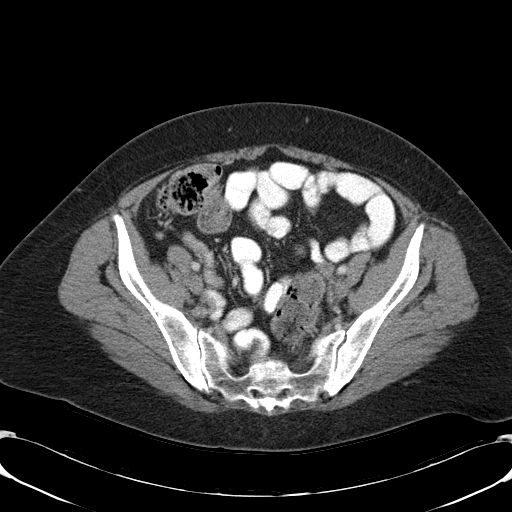
[im 36/88  soft-tissue]
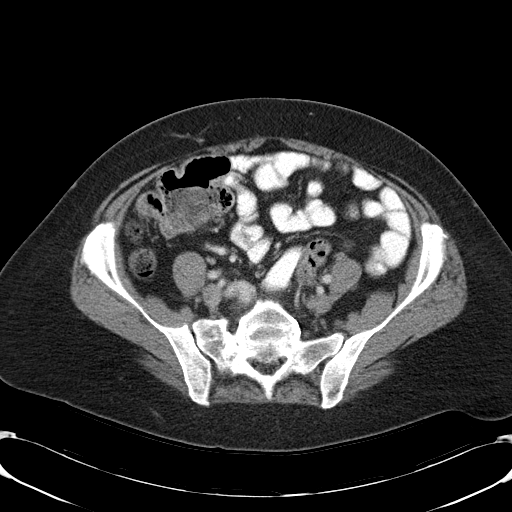
[im 47/88  soft-tissue]
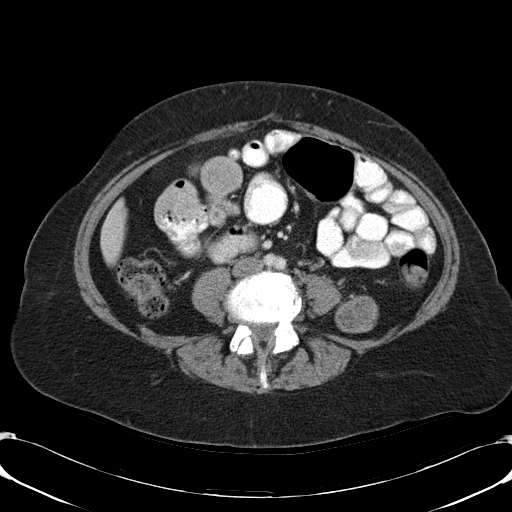
[im 52/88  soft-tissue]
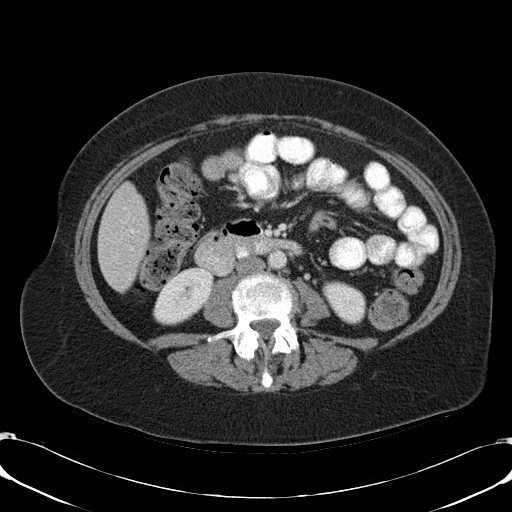
[im 57/88  soft-tissue]
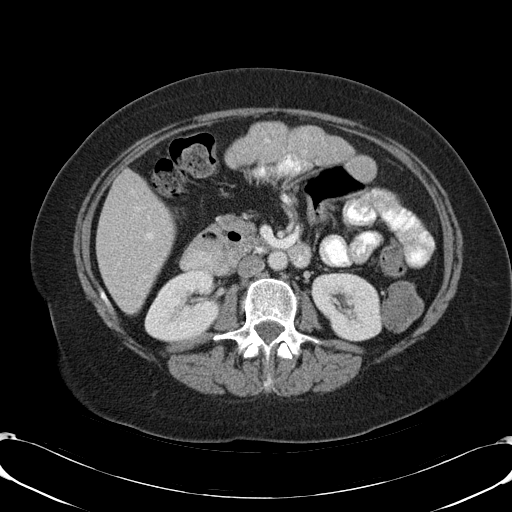
[im 57/88  bone]
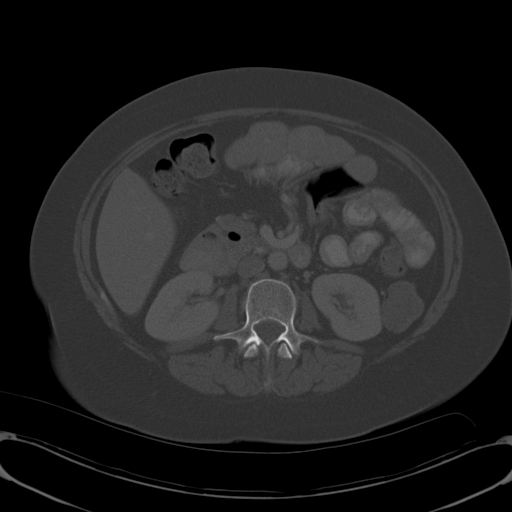
[im 62/88  soft-tissue]
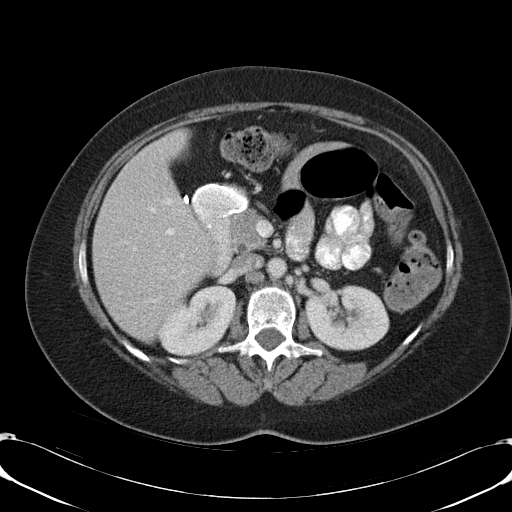
[im 67/88  soft-tissue]
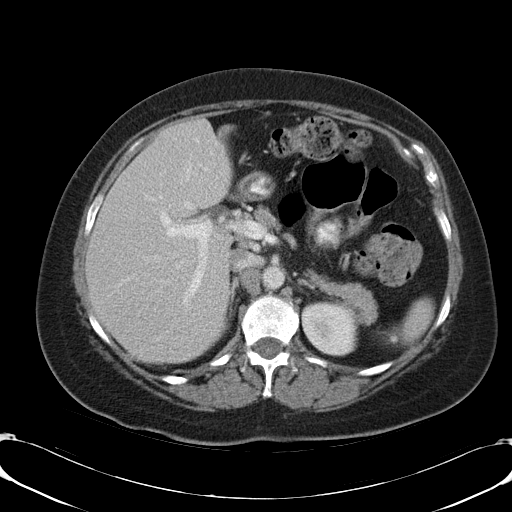
[im 77/88  soft-tissue]
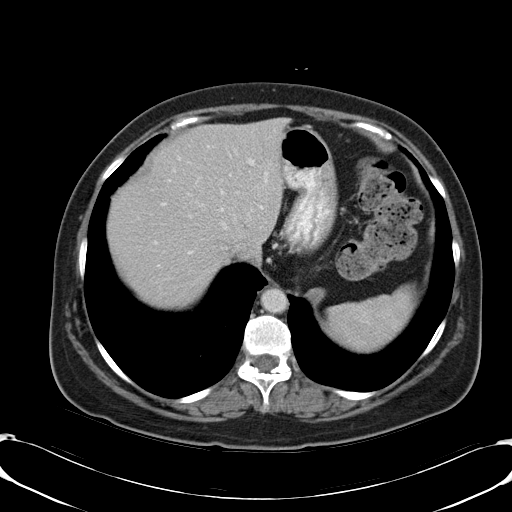
[im 82/88  soft-tissue]
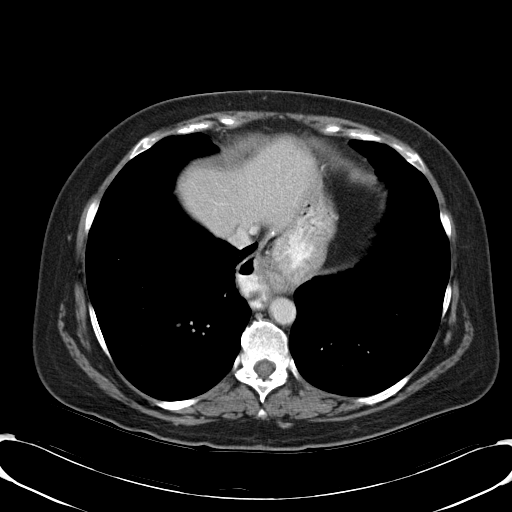

[Series 4: abd_pel_with 3.0 spo cor · coronal · 0.71mm/px · 3 of 91 slices shown]
[im 31/91  soft-tissue]
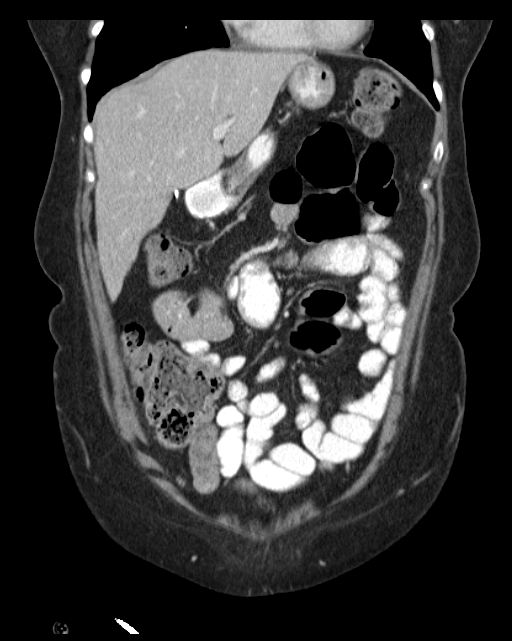
[im 41/91  soft-tissue]
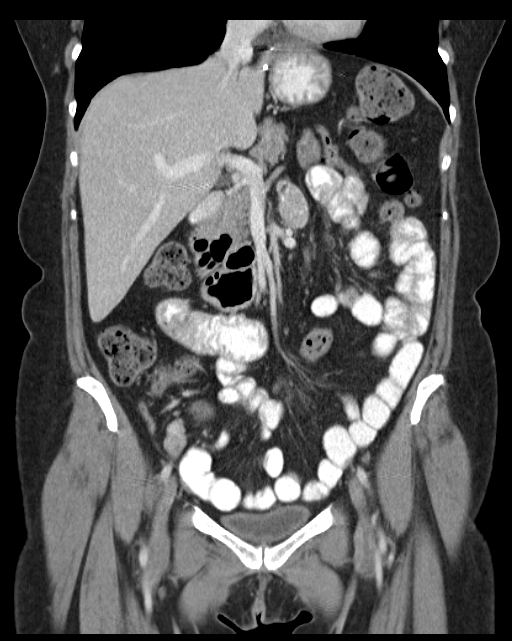
[im 51/91  soft-tissue]
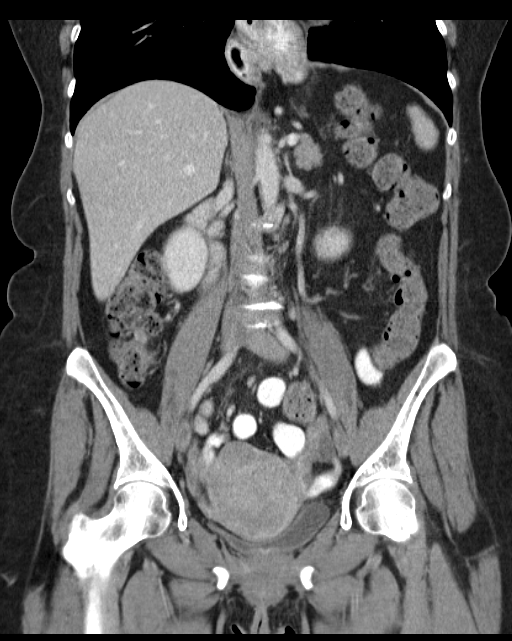

[16 of 46 positions shown; findings below may reference images not displayed]

FINDINGS: Lung bases clear.
Hiatal hernia similar to previous exam.
Surgical clips at gastric cardia and gallbladder fossa.
Liver, spleen, pancreas, kidneys, and adrenal glands normal
appearance.
Normal appendix.
Minimal pericardial fluid.
Remainder of stomach and bowel loops normal appearance.
Minimally lobulated uterus, question leiomyomata.
Adnexae, bladder, and ureters unremarkable.
No mass, adenopathy, free fluid or inflammatory process.
No acute osseous findings.
Degenerative disc disease changes at multiple levels of the lumbar
spine.
IMPRESSION: Question uterine leiomyomata.
Hiatal hernia in patient by history post fundoplication.
No acute intra abdominal or intrapelvic abnormalities.

## 2013-10-05 ENCOUNTER — Ambulatory Visit (HOSPITAL_COMMUNITY)
Admission: RE | Admit: 2013-10-05 | Discharge: 2013-10-05 | Disposition: A | Discharge status: Home / Self Care | Payer: Federal, State, Local not specified - PPO | Source: Ambulatory Visit | Attending: Family Medicine | Admitting: Family Medicine

## 2013-10-05 ENCOUNTER — Other Ambulatory Visit (HOSPITAL_COMMUNITY): Payer: Self-pay | Admitting: Family Medicine

## 2013-10-05 DIAGNOSIS — M79609 Pain in unspecified limb: Secondary | ICD-10-CM | POA: Insufficient documentation

## 2013-10-05 DIAGNOSIS — M2010 Hallux valgus (acquired), unspecified foot: Secondary | ICD-10-CM | POA: Insufficient documentation

## 2013-10-05 DIAGNOSIS — M79672 Pain in left foot: Secondary | ICD-10-CM

## 2014-01-01 ENCOUNTER — Emergency Department (HOSPITAL_COMMUNITY)

## 2014-01-01 ENCOUNTER — Emergency Department (HOSPITAL_COMMUNITY)
Admission: EM | Admit: 2014-01-01 | Discharge: 2014-01-01 | Disposition: A | Discharge status: Home / Self Care | Attending: Emergency Medicine | Admitting: Emergency Medicine

## 2014-01-01 ENCOUNTER — Encounter (HOSPITAL_COMMUNITY): Payer: Self-pay | Admitting: Emergency Medicine

## 2014-01-01 DIAGNOSIS — S0101XA Laceration without foreign body of scalp, initial encounter: Secondary | ICD-10-CM | POA: Diagnosis not present

## 2014-01-01 DIAGNOSIS — Z23 Encounter for immunization: Secondary | ICD-10-CM | POA: Insufficient documentation

## 2014-01-01 DIAGNOSIS — K219 Gastro-esophageal reflux disease without esophagitis: Secondary | ICD-10-CM | POA: Insufficient documentation

## 2014-01-01 DIAGNOSIS — Y998 Other external cause status: Secondary | ICD-10-CM | POA: Insufficient documentation

## 2014-01-01 DIAGNOSIS — Y9289 Other specified places as the place of occurrence of the external cause: Secondary | ICD-10-CM | POA: Diagnosis not present

## 2014-01-01 DIAGNOSIS — S0990XA Unspecified injury of head, initial encounter: Secondary | ICD-10-CM | POA: Diagnosis present

## 2014-01-01 DIAGNOSIS — Z79899 Other long term (current) drug therapy: Secondary | ICD-10-CM | POA: Diagnosis not present

## 2014-01-01 DIAGNOSIS — S199XXA Unspecified injury of neck, initial encounter: Secondary | ICD-10-CM | POA: Diagnosis not present

## 2014-01-01 DIAGNOSIS — W19XXXA Unspecified fall, initial encounter: Secondary | ICD-10-CM

## 2014-01-01 DIAGNOSIS — Z792 Long term (current) use of antibiotics: Secondary | ICD-10-CM | POA: Insufficient documentation

## 2014-01-01 DIAGNOSIS — W01198A Fall on same level from slipping, tripping and stumbling with subsequent striking against other object, initial encounter: Secondary | ICD-10-CM | POA: Diagnosis not present

## 2014-01-01 DIAGNOSIS — D649 Anemia, unspecified: Secondary | ICD-10-CM | POA: Insufficient documentation

## 2014-01-01 DIAGNOSIS — Z791 Long term (current) use of non-steroidal anti-inflammatories (NSAID): Secondary | ICD-10-CM | POA: Diagnosis not present

## 2014-01-01 DIAGNOSIS — Y9389 Activity, other specified: Secondary | ICD-10-CM | POA: Insufficient documentation

## 2014-01-01 MED ORDER — TETANUS-DIPHTH-ACELL PERTUSSIS 5-2.5-18.5 LF-MCG/0.5 IM SUSP
0.5000 mL | Freq: Once | INTRAMUSCULAR | Status: AC
  Administered 2014-01-01: 0.5 mL via INTRAMUSCULAR
  Filled 2014-01-01: qty 0.5

## 2014-01-01 MED ORDER — ACETAMINOPHEN 325 MG PO TABS
650.0000 mg | ORAL_TABLET | Freq: Once | ORAL | Status: AC
  Administered 2014-01-01: 650 mg via ORAL
  Filled 2014-01-01: qty 2

## 2014-01-01 MED ORDER — ONDANSETRON 4 MG PO TBDP
4.0000 mg | ORAL_TABLET | Freq: Once | ORAL | Status: AC
  Administered 2014-01-01: 4 mg via ORAL
  Filled 2014-01-01: qty 1

## 2014-01-01 MED ORDER — HYDROCODONE-ACETAMINOPHEN 5-325 MG PO TABS: 1.0000 | ORAL_TABLET | Freq: Four times a day (QID) | ORAL | Status: DC | PRN

## 2014-01-01 MED ORDER — LIDOCAINE-EPINEPHRINE 2 %-1:100000 IJ SOLN
20.0000 mL | Freq: Once | INTRAMUSCULAR | Status: AC
  Administered 2014-01-01: 20 mL
  Filled 2014-01-01: qty 1

## 2014-01-01 MED ORDER — ONDANSETRON HCL 4 MG PO TABS: 4.0000 mg | ORAL_TABLET | Freq: Four times a day (QID) | ORAL | Status: DC

## 2014-01-01 NOTE — ED Provider Notes (Signed)
CSN: [id number]     Arrival date & time 01/01/14  1842 History   First MD Initiated Contact with Patient 01/01/14 1943     Chief Complaint  Patient presents with  . Fall    head injury     (Consider location/radiation/quality/duration/timing/severity/associated sxs/prior Treatment) HPI Comments: Denies LOC but is having nausea and headache.  Denies vision changes, numbness or tingling in the arms or legs.  Was able to walk without difficulty  Patient is a 58 y.o. female presenting with fall. The history is provided by the patient.  Fall This is a new (was at work and tripped and fell backwards and hit her head on the floor) problem. The current episode started 1 to 2 hours ago. The problem occurs constantly. The problem has not changed since onset.Associated symptoms include headaches. Associated symptoms comments: Nausea and neck pain. Nothing aggravates the symptoms. Nothing relieves the symptoms. She has tried nothing for the symptoms. The treatment provided no relief.    Past Medical History  Diagnosis Date  . GERD (gastroesophageal reflux disease)   . Anemia     reportedly IDA; GIVENS performed by Dr. Elnoria Howard Oct 2009, no abnormalities  . S/P endoscopy Sept 2009    Dr. Elnoria Howard: hiatal hernia  . S/P colonoscopy Sept 2009    normal colon, large hemorrhoids   Past Surgical History  Procedure Laterality Date  . Bilateral foot surgery    . Rotator cuff repair    . Cholecystectomy    . Tonsillectomy    . Adenoidectomy    . Nose surgery    . Nissen fundoplication    . Givens capsule study  Oct 2009    Dr. Elnoria Howard: normal   Family History  Problem Relation Age of Onset  . Colon cancer Neg Hx   . Breast cancer Sister    History  Substance Use Topics  . Smoking status: Never Smoker   . Smokeless tobacco: Not on file  . Alcohol Use: No   OB History    No data available     Review of Systems  Neurological: Positive for headaches.  All other systems reviewed and are  negative.     Allergies  Bismuth subsalicylate and Codeine  Home Medications   Prior to Admission medications   Medication Sig Start Date End Date Taking? Authorizing Provider  DULoxetine (CYMBALTA) 20 MG capsule Take 1 capsule by mouth at bedtime.  07/12/13  Yes Historical Provider, MD  esomeprazole (NEXIUM) 40 MG capsule Take 40 mg by mouth daily before breakfast.     Yes Historical Provider, MD  meloxicam (MOBIC) 15 MG tablet Take 15 mg by mouth daily.   Yes Historical Provider, MD  Multiple Vitamins-Minerals (HAIR/SKIN/NAILS PO) Take 2 tablets by mouth daily.   Yes Historical Provider, MD  ranitidine (ZANTAC) 300 MG tablet Take 300 mg by mouth at bedtime.   Yes Historical Provider, MD  HYDROcodone-acetaminophen (NORCO/VICODIN) 5-325 MG per tablet Take one-two tabs po q 4-6 hrs prn pain Patient not taking: Reported on 01/01/2014 07/17/13   Tammy L. Triplett, PA-C  sulfamethoxazole-trimethoprim (BACTRIM DS) 800-160 MG per tablet Take 1 tablet by mouth 2 (two) times daily. For 10 days Patient not taking: Reported on 01/01/2014 07/17/13   Tammy L. Triplett, PA-C   BP 146/104 mmHg  Pulse 89  Temp(Src) 97.5 F (36.4 C)  Resp 16  SpO2 100% Physical Exam  Constitutional: She is oriented to person, place, and time. She appears well-developed and well-nourished. No distress.  HENT:  Head: Normocephalic. Head is with laceration.    Eyes: EOM are normal. Pupils are equal, round, and reactive to light.  Neck: Spinous process tenderness present. No muscular tenderness present. Normal range of motion present.    Cardiovascular: Normal rate, regular rhythm, normal heart sounds and intact distal pulses.  Exam reveals no friction rub.   No murmur heard. Pulmonary/Chest: Effort normal and breath sounds normal. She has no wheezes. She has no rales.  Abdominal: Soft. Bowel sounds are normal. She exhibits no distension. There is no tenderness. There is no rebound and no guarding.   Musculoskeletal: Normal range of motion. She exhibits no tenderness.  No edema  Neurological: She is alert and oriented to person, place, and time. She has normal strength. No cranial nerve deficit or sensory deficit. Coordination and gait normal.  Skin: Skin is warm and dry. No rash noted.  Psychiatric: She has a normal mood and affect. Her behavior is normal.  Nursing note and vitals reviewed.   ED Course  Procedures (including critical care time) Labs Review Labs Reviewed - No data to display  Imaging Review Ct Head Wo Contrast  01/01/2014   CLINICAL DATA:  Fall backwards with occipital head injury. Initial encounter.  EXAM: CT HEAD WITHOUT CONTRAST  CT CERVICAL SPINE WITHOUT CONTRAST  TECHNIQUE: Multidetector CT imaging of the head and cervical spine was performed following the standard protocol without intravenous contrast. Multiplanar CT image reconstructions of the cervical spine were also generated.  COMPARISON:  Head CT on 03/30/2007  FINDINGS: CT HEAD FINDINGS  Prominent scalp hematoma noted at the posterior vertex. No underlying skull fracture is seen. The brain demonstrates no evidence of hemorrhage, infarction, edema, mass effect, extra-axial fluid collection, hydrocephalus or mass lesion.  CT CERVICAL SPINE FINDINGS  The cervical spine shows normal alignment. There is no evidence of acute fracture or subluxation. No soft tissue swelling or hematoma is identified. No bony or soft tissue lesions are seen. Spondylosis present at C3-4. The visualized airway is normally patent.  IMPRESSION: 1. Scalp hematoma without evidence of skull fracture or intracranial findings. 2. No evidence of cervical spine injury.   Electronically Signed   By: Irish LackGlenn  Yamagata M.D.   On: 01/01/2014 20:48   Ct Cervical Spine Wo Contrast  01/01/2014   CLINICAL DATA:  Fall backwards with occipital head injury. Initial encounter.  EXAM: CT HEAD WITHOUT CONTRAST  CT CERVICAL SPINE WITHOUT CONTRAST  TECHNIQUE:  Multidetector CT imaging of the head and cervical spine was performed following the standard protocol without intravenous contrast. Multiplanar CT image reconstructions of the cervical spine were also generated.  COMPARISON:  Head CT on 03/30/2007  FINDINGS: CT HEAD FINDINGS  Prominent scalp hematoma noted at the posterior vertex. No underlying skull fracture is seen. The brain demonstrates no evidence of hemorrhage, infarction, edema, mass effect, extra-axial fluid collection, hydrocephalus or mass lesion.  CT CERVICAL SPINE FINDINGS  The cervical spine shows normal alignment. There is no evidence of acute fracture or subluxation. No soft tissue swelling or hematoma is identified. No bony or soft tissue lesions are seen. Spondylosis present at C3-4. The visualized airway is normally patent.  IMPRESSION: 1. Scalp hematoma without evidence of skull fracture or intracranial findings. 2. No evidence of cervical spine injury.   Electronically Signed   By: Irish LackGlenn  Yamagata M.D.   On: 01/01/2014 20:48     EKG Interpretation None     LACERATION REPAIR Performed by: Gwyneth SproutPLUNKETT,Tametria Aho Authorized by: Gwyneth SproutPLUNKETT,Alle Difabio Consent: Verbal consent obtained.  Risks and benefits: risks, benefits and alternatives were discussed Consent given by: patient Patient identity confirmed: provided demographic data Prepped and Draped in normal sterile fashion Wound explored  Laceration Location: occiput  Laceration Length: 3cm  No Foreign Bodies seen or palpated  Anesthesia: local infiltration  Local anesthetic: lidocaine 2% with epinephrine  Anesthetic total: 4 ml  Irrigation method: syringe Amount of cleaning: standard  Skin closure: staples  Number of sutures: 5  Technique: staples  Patient tolerance: Patient tolerated the procedure well with no immediate complications.  MDM   Final diagnoses:  Fall    Patient with a mechanical fall and injury to the back of the head and having pain in the mid  C-spine. Denies any numbness or tingling in the upper or lower extremities.  Tetanus shot is unknown. Patient is complaining of a headache and nausea but is otherwise neurovascularly intact. He takes no anticoagulation. Head and C-spine CT pending. Tetanus shot updated. Wound repair as above.  8:53 PM Imaging neg.  Gwyneth SproutWhitney Faithe Ariola, MD 01/01/14 2126

## 2014-01-01 NOTE — ED Notes (Signed)
Pt fell at work, she tripped and fell backward, head injury to occipital area. Bleeding is controled. Pt received first aid treatment from ems crew, yet pt refused transport and walked in with supervisor. Pt denies loc, co headache.

## 2014-01-30 ENCOUNTER — Other Ambulatory Visit: Payer: Self-pay | Admitting: Physician Assistant

## 2014-01-30 LAB — COMPLETE METABOLIC PANEL WITH GFR
ALT: 59 U/L — ABNORMAL HIGH (ref 0–35)
AST: 42 U/L — ABNORMAL HIGH (ref 0–37)
Albumin: 4.2 g/dL (ref 3.5–5.2)
Alkaline Phosphatase: 82 U/L (ref 39–117)
BUN: 15 mg/dL (ref 6–23)
CO2: 26 mEq/L (ref 19–32)
Calcium: 9 mg/dL (ref 8.4–10.5)
Chloride: 105 mEq/L (ref 96–112)
Creat: 0.63 mg/dL (ref 0.50–1.10)
GFR, Est African American: >89 mL/min
GFR, Est Non African American: >89 mL/min
Glucose, Bld: 85 mg/dL (ref 70–99)
Potassium: 4.1 mEq/L (ref 3.5–5.3)
Sodium: 140 mEq/L (ref 135–145)
Total Bilirubin: 0.3 mg/dL (ref 0.2–1.2)
Total Protein: 7 g/dL (ref 6.0–8.3)

## 2014-02-07 ENCOUNTER — Telehealth: Payer: Self-pay

## 2014-02-07 NOTE — Telephone Encounter (Signed)
Pt was seen through W/C, [facility]. To follow through on her [facility].

## 2014-02-07 NOTE — Telephone Encounter (Signed)
Pt called and left a message stating that she would like instructions and code to access her mychart account. Please return call and advise. CB # [phone]

## 2014-02-22 ENCOUNTER — Other Ambulatory Visit: Payer: Self-pay | Admitting: Family Medicine

## 2014-03-08 NOTE — Telephone Encounter (Signed)
[name], this is a WC pt of Dr Copland's, and I was unable to locate chart until now. We are waiting for authorization for an MRI, and Dr Patsy Lageropland is not back for several more days. Can we RF this? Chart is in Dr Copland's box [id number] (pls return this to [employer] when done).

## 2014-03-08 NOTE — Telephone Encounter (Signed)
I think a refill is fine while waiting on her referral for MRI.  (FYI chart is not in Dr Cyndie Chimeopland's box)

## 2014-03-23 ENCOUNTER — Other Ambulatory Visit (HOSPITAL_COMMUNITY): Payer: Self-pay | Admitting: Physician Assistant

## 2014-03-23 DIAGNOSIS — M5136 Other intervertebral disc degeneration, lumbar region: Secondary | ICD-10-CM

## 2014-03-23 DIAGNOSIS — M5126 Other intervertebral disc displacement, lumbar region: Secondary | ICD-10-CM

## 2014-03-29 ENCOUNTER — Other Ambulatory Visit: Payer: Self-pay | Admitting: Physician Assistant

## 2014-03-30 NOTE — Telephone Encounter (Signed)
This is WC pt. Checked w/Dr Copland who OKd pt's RF

## 2014-04-02 ENCOUNTER — Ambulatory Visit (HOSPITAL_COMMUNITY)
Admission: RE | Admit: 2014-04-02 | Discharge: 2014-04-02 | Disposition: A | Discharge status: Home / Self Care | Payer: Federal, State, Local not specified - PPO | Source: Ambulatory Visit | Attending: Physician Assistant | Admitting: Physician Assistant

## 2014-04-02 DIAGNOSIS — M47816 Spondylosis without myelopathy or radiculopathy, lumbar region: Secondary | ICD-10-CM | POA: Diagnosis not present

## 2014-04-02 DIAGNOSIS — M5136 Other intervertebral disc degeneration, lumbar region: Secondary | ICD-10-CM

## 2014-04-02 DIAGNOSIS — M545 Low back pain: Secondary | ICD-10-CM | POA: Insufficient documentation

## 2014-04-02 DIAGNOSIS — M5126 Other intervertebral disc displacement, lumbar region: Secondary | ICD-10-CM

## 2014-05-22 ENCOUNTER — Other Ambulatory Visit: Payer: Self-pay | Admitting: Family Medicine

## 2014-05-23 NOTE — Telephone Encounter (Signed)
I spoke to [place] about status of W/C and she advised that pt would need to provide a claim # or come in on her own insurance, but she has not been referred to a specialist at this point (all referrals were waiting for claim #). Called pt to see how she is and advise to RTC and she stated she does not need the flexeril, she is doing great.

## 2014-09-03 ENCOUNTER — Encounter: Payer: Self-pay | Admitting: Internal Medicine

## 2014-09-26 ENCOUNTER — Ambulatory Visit (INDEPENDENT_AMBULATORY_CARE_PROVIDER_SITE_OTHER): Payer: Federal, State, Local not specified - PPO | Admitting: Gastroenterology

## 2014-09-26 ENCOUNTER — Encounter: Payer: Self-pay | Admitting: Gastroenterology

## 2014-09-26 VITALS — BP 124/74 | HR 87 | Temp 98.2°F | Ht 64.0 in | Wt 171.2 lb

## 2014-09-26 DIAGNOSIS — K219 Gastro-esophageal reflux disease without esophagitis: Secondary | ICD-10-CM | POA: Diagnosis not present

## 2014-09-26 NOTE — Patient Instructions (Signed)
You may continue Nexium over the counter. Please let us know if this ever stops working for you.  I have attached the reflux information sheet.   Enjoy retirement, and we will see you back in 1 year!

## 2014-09-26 NOTE — Progress Notes (Signed)
Primary Care Physician:  Cassell Smiles., MD Primary Gastroenterologist:  Dr. Jena Gauss   Chief Complaint  Patient presents with  . Gastrophageal Reflux    HPI:   [name] is a 59 y.o. female presenting today at the request of her PCP secondary to GERD. She was last seen in Jan 2013. Chronic GERD, with history of Nissen in early 2000s. She has a history of IDA and underwent an EGD and colonoscopy by Dr. Elnoria Howard in Sept 2009. This showed a 5 cm hiatal hernia. Colonoscopy showed normal colon, large hemorrhoids. Capsule study normal at that time.   Was on Nexium prescription but then insurance stopped covering so went to OTC. Zantac prn. Has been taking OTC Nexium with good results, feels it is even better than the generic. Taking 2 Nexium each morning and now NO Zantac each evening. Had flare of GERD symptoms while taking generic Nexium but OTC works well. No dysphagia. Only time has nausea if she has severe heartburn, which hasn't happened since back on Nexium. Has a hard time vomiting due to history of Nissen. No loss of appetite, has had weight gain. Retired last April.  Hit head in Nov of last year and gained weight since then. No issues with abdominal pain. States she has IBS flares every once in awhile. Sometimes after eating has urgency to go to bathroom. No rectal bleeding.     Past Medical History  Diagnosis Date  . GERD (gastroesophageal reflux disease)   . Anemia     reportedly IDA; GIVENS performed by Dr. Elnoria Howard Oct 2009, no abnormalities  . S/P endoscopy Sept 2009    Dr. Elnoria Howard: hiatal hernia  . S/P colonoscopy Sept 2009    normal colon, large hemorrhoids    Past Surgical History  Procedure Laterality Date  . Bilateral foot surgery    . Rotator cuff repair    . Cholecystectomy    . Tonsillectomy    . Adenoidectomy    . Nose surgery    . Nissen fundoplication    . Givens capsule study  Oct 2009    Dr. Elnoria Howard: normal  . Endoscopy and colonoscopy  2009    Dr. Elnoria Howard:  hiatal hernia, normal colon, large hemorrhoids    Current Outpatient Prescriptions  Medication Sig Dispense Refill  . diclofenac (VOLTAREN) 75 MG EC tablet Take 75 mg by mouth 2 (two) times daily.     . DULoxetine (CYMBALTA) 20 MG capsule Take 1 capsule by mouth at bedtime.     Marland Kitchen esomeprazole (NEXIUM) 40 MG capsule Take 40 mg by mouth daily before breakfast.      . HYDROcodone-acetaminophen (NORCO/VICODIN) 5-325 MG per tablet Take 1-2 tablets by mouth every 6 (six) hours as needed for moderate pain or severe pain. 15 tablet 0  . Multiple Vitamins-Minerals (HAIR/SKIN/NAILS PO) Take 2 tablets by mouth daily.    . ranitidine (ZANTAC) 300 MG tablet Take 300 mg by mouth at bedtime.    . cyclobenzaprine (FLEXERIL) 10 MG tablet TAKE ONE-HALF TO ONE TABLET AT BEDTIME AS NEEDED (Patient not taking: Reported on 09/26/2014) 20 tablet 1  . meloxicam (MOBIC) 15 MG tablet Take 15 mg by mouth daily.    . ondansetron (ZOFRAN) 4 MG tablet Take 1 tablet (4 mg total) by mouth every 6 (six) hours. (Patient not taking: Reported on 09/26/2014) 12 tablet 0  . sulfamethoxazole-trimethoprim (BACTRIM DS) 800-160 MG per tablet Take 1 tablet by mouth 2 (two) times daily. For 10 days (Patient not  taking: Reported on 01/01/2014) 20 tablet 0   No current facility-administered medications for this visit.    Allergies as of 09/26/2014 - Review Complete 01/01/2014  Allergen Reaction Noted  . Bismuth subsalicylate Nausea And Vomiting 01/21/2011  . Codeine Nausea And Vomiting 01/21/2011    Family History  Problem Relation Age of Onset  . Colon cancer Neg Hx   . Breast cancer Sister     Social History   Social History  . Marital Status: Married    Spouse Name: N/A  . Number of Children: 0  . Years of Education: N/A   Occupational History  .  [employer]   Social History Main Topics  . Smoking status: Never Smoker   . Smokeless tobacco: Not on file  . Alcohol Use: No  . Drug Use: No  . Sexual Activity: Not  on file   Other Topics Concern  . Not on file   Social History Narrative    Review of Systems: Negative unless mentioned in HPI  Physical Exam: BP 124/74 mmHg  Pulse 87  Temp(Src) 98.2 F (36.8 C) (Oral)  Ht 5\' 4"  (1.626 m)  Wt 171 lb 3.2 oz (77.656 kg)  BMI 29.37 kg/m2 General:   Alert and oriented. Pleasant and cooperative. Well-nourished and well-developed.  Head:  Normocephalic and atraumatic. Eyes:  Without icterus, sclera clear and conjunctiva pink.  Ears:  Normal auditory acuity. Nose:  No deformity, discharge,  or lesions. Mouth:  No deformity or lesions, oral mucosa pink.  Lungs:  Clear to auscultation bilaterally. No wheezes, rales, or rhonchi. No distress.  Heart:  S1, S2 present without murmurs appreciated.  Abdomen:  +BS, soft, non-tender and non-distended. No HSM noted. No guarding or rebound. No masses appreciated.  Rectal:  Deferred  Msk:  Symmetrical without gross deformities. Normal posture. Extremities:  Without edema. Neurologic:  Alert and  oriented x4;  grossly normal neurologically. Skin:  Intact without significant lesions or rashes. Psych:  Alert and cooperative. Normal mood and affect.

## 2014-10-03 NOTE — Assessment & Plan Note (Signed)
59 year old female with history of chronic GERD, controlled with Nexium OTC. No alarm symptoms noted. Last EGD in 2009 unremarkable. Next colonoscopy due 2019. Return in 1 year or sooner if needed.

## 2014-10-04 NOTE — Progress Notes (Signed)
CC'ED TO PCP 

## 2015-06-14 DIAGNOSIS — M4155 Other secondary scoliosis, thoracolumbar region: Secondary | ICD-10-CM | POA: Diagnosis not present

## 2015-06-14 DIAGNOSIS — M545 Low back pain: Secondary | ICD-10-CM | POA: Diagnosis not present

## 2015-06-14 DIAGNOSIS — M5136 Other intervertebral disc degeneration, lumbar region: Secondary | ICD-10-CM | POA: Diagnosis not present

## 2015-06-14 DIAGNOSIS — M4806 Spinal stenosis, lumbar region: Secondary | ICD-10-CM | POA: Diagnosis not present

## 2015-06-18 DIAGNOSIS — S83241A Other tear of medial meniscus, current injury, right knee, initial encounter: Secondary | ICD-10-CM | POA: Diagnosis not present

## 2015-07-02 DIAGNOSIS — S83206D Unspecified tear of unspecified meniscus, current injury, right knee, subsequent encounter: Secondary | ICD-10-CM | POA: Diagnosis not present

## 2015-07-03 ENCOUNTER — Other Ambulatory Visit (HOSPITAL_COMMUNITY): Payer: Self-pay | Admitting: Orthopedic Surgery

## 2015-07-03 DIAGNOSIS — S83206A Unspecified tear of unspecified meniscus, current injury, right knee, initial encounter: Secondary | ICD-10-CM

## 2015-07-10 ENCOUNTER — Ambulatory Visit (HOSPITAL_COMMUNITY)
Admission: RE | Admit: 2015-07-10 | Discharge: 2015-07-10 | Disposition: A | Discharge status: Home / Self Care | Payer: Federal, State, Local not specified - PPO | Source: Ambulatory Visit | Attending: Orthopedic Surgery | Admitting: Orthopedic Surgery

## 2015-07-10 DIAGNOSIS — M23321 Other meniscus derangements, posterior horn of medial meniscus, right knee: Secondary | ICD-10-CM | POA: Insufficient documentation

## 2015-07-10 DIAGNOSIS — T1490 Injury, unspecified: Secondary | ICD-10-CM | POA: Insufficient documentation

## 2015-07-10 DIAGNOSIS — S72431A Displaced fracture of medial condyle of right femur, initial encounter for closed fracture: Secondary | ICD-10-CM | POA: Diagnosis not present

## 2015-07-10 DIAGNOSIS — S83206A Unspecified tear of unspecified meniscus, current injury, right knee, initial encounter: Secondary | ICD-10-CM

## 2015-07-10 DIAGNOSIS — S83241A Other tear of medial meniscus, current injury, right knee, initial encounter: Secondary | ICD-10-CM | POA: Diagnosis not present

## 2015-07-10 DIAGNOSIS — M84451A Pathological fracture, right femur, initial encounter for fracture: Secondary | ICD-10-CM | POA: Insufficient documentation

## 2015-07-10 DIAGNOSIS — M948X6 Other specified disorders of cartilage, lower leg: Secondary | ICD-10-CM | POA: Diagnosis not present

## 2015-07-10 DIAGNOSIS — M25561 Pain in right knee: Secondary | ICD-10-CM | POA: Diagnosis not present

## 2015-07-16 DIAGNOSIS — S83206D Unspecified tear of unspecified meniscus, current injury, right knee, subsequent encounter: Secondary | ICD-10-CM | POA: Diagnosis not present

## 2015-07-24 DIAGNOSIS — S83241A Other tear of medial meniscus, current injury, right knee, initial encounter: Secondary | ICD-10-CM | POA: Diagnosis not present

## 2015-07-24 DIAGNOSIS — S83281A Other tear of lateral meniscus, current injury, right knee, initial encounter: Secondary | ICD-10-CM | POA: Diagnosis not present

## 2015-07-24 DIAGNOSIS — M94261 Chondromalacia, right knee: Secondary | ICD-10-CM | POA: Diagnosis not present

## 2015-07-24 DIAGNOSIS — M659 Synovitis and tenosynovitis, unspecified: Secondary | ICD-10-CM | POA: Diagnosis not present

## 2015-07-24 DIAGNOSIS — G8918 Other acute postprocedural pain: Secondary | ICD-10-CM | POA: Diagnosis not present

## 2015-07-30 DIAGNOSIS — S83206D Unspecified tear of unspecified meniscus, current injury, right knee, subsequent encounter: Secondary | ICD-10-CM | POA: Diagnosis not present

## 2015-08-12 ENCOUNTER — Encounter: Payer: Self-pay | Admitting: Internal Medicine

## 2015-08-27 DIAGNOSIS — S83206D Unspecified tear of unspecified meniscus, current injury, right knee, subsequent encounter: Secondary | ICD-10-CM | POA: Diagnosis not present

## 2015-10-08 DIAGNOSIS — S83206D Unspecified tear of unspecified meniscus, current injury, right knee, subsequent encounter: Secondary | ICD-10-CM | POA: Diagnosis not present

## 2015-10-11 DIAGNOSIS — Z1389 Encounter for screening for other disorder: Secondary | ICD-10-CM | POA: Diagnosis not present

## 2015-10-11 DIAGNOSIS — Z6825 Body mass index (BMI) 25.0-25.9, adult: Secondary | ICD-10-CM | POA: Diagnosis not present

## 2015-10-11 DIAGNOSIS — Z0001 Encounter for general adult medical examination with abnormal findings: Secondary | ICD-10-CM | POA: Diagnosis not present

## 2015-10-11 DIAGNOSIS — K219 Gastro-esophageal reflux disease without esophagitis: Secondary | ICD-10-CM | POA: Diagnosis not present

## 2015-10-11 DIAGNOSIS — L409 Psoriasis, unspecified: Secondary | ICD-10-CM | POA: Diagnosis not present

## 2015-10-11 DIAGNOSIS — M329 Systemic lupus erythematosus, unspecified: Secondary | ICD-10-CM | POA: Diagnosis not present

## 2015-10-17 DIAGNOSIS — E538 Deficiency of other specified B group vitamins: Secondary | ICD-10-CM | POA: Diagnosis not present

## 2015-10-17 DIAGNOSIS — E782 Mixed hyperlipidemia: Secondary | ICD-10-CM | POA: Diagnosis not present

## 2015-10-17 DIAGNOSIS — D510 Vitamin B12 deficiency anemia due to intrinsic factor deficiency: Secondary | ICD-10-CM | POA: Diagnosis not present

## 2015-10-29 DIAGNOSIS — Z6825 Body mass index (BMI) 25.0-25.9, adult: Secondary | ICD-10-CM | POA: Diagnosis not present

## 2015-10-29 DIAGNOSIS — Z1389 Encounter for screening for other disorder: Secondary | ICD-10-CM | POA: Diagnosis not present

## 2015-10-29 DIAGNOSIS — E663 Overweight: Secondary | ICD-10-CM | POA: Diagnosis not present

## 2015-10-29 DIAGNOSIS — G4700 Insomnia, unspecified: Secondary | ICD-10-CM | POA: Diagnosis not present

## 2015-10-30 DIAGNOSIS — Z1231 Encounter for screening mammogram for malignant neoplasm of breast: Secondary | ICD-10-CM | POA: Diagnosis not present

## 2015-10-30 DIAGNOSIS — Z803 Family history of malignant neoplasm of breast: Secondary | ICD-10-CM | POA: Diagnosis not present

## 2015-12-23 DIAGNOSIS — S83206D Unspecified tear of unspecified meniscus, current injury, right knee, subsequent encounter: Secondary | ICD-10-CM | POA: Diagnosis not present

## 2015-12-25 ENCOUNTER — Telehealth: Payer: Self-pay | Admitting: Nurse Practitioner

## 2015-12-25 ENCOUNTER — Ambulatory Visit: Payer: Self-pay | Admitting: Nurse Practitioner

## 2015-12-25 ENCOUNTER — Encounter: Payer: Self-pay | Admitting: Nurse Practitioner

## 2015-12-25 NOTE — Telephone Encounter (Signed)
PT WAS A NO SHOW AND LETTER SENT  °

## 2015-12-25 NOTE — Telephone Encounter (Signed)
Noted  

## 2015-12-27 DIAGNOSIS — M25512 Pain in left shoulder: Secondary | ICD-10-CM | POA: Diagnosis not present

## 2015-12-31 ENCOUNTER — Telehealth: Payer: Self-pay

## 2015-12-31 ENCOUNTER — Encounter: Payer: Self-pay | Admitting: Nurse Practitioner

## 2015-12-31 ENCOUNTER — Ambulatory Visit (INDEPENDENT_AMBULATORY_CARE_PROVIDER_SITE_OTHER): Payer: Federal, State, Local not specified - PPO | Admitting: Nurse Practitioner

## 2015-12-31 VITALS — BP 123/73 | HR 90 | Temp 98.1°F | Ht 64.0 in | Wt 154.8 lb

## 2015-12-31 DIAGNOSIS — K219 Gastro-esophageal reflux disease without esophagitis: Secondary | ICD-10-CM

## 2015-12-31 MED ORDER — DEXLANSOPRAZOLE 60 MG PO CPDR: 60.0000 mg | DELAYED_RELEASE_CAPSULE | Freq: Every day | ORAL | 0 refills | Status: DC

## 2015-12-31 NOTE — Progress Notes (Addendum)
Referring Provider: Elfredia NevinsFusco, Lawrence, MD Primary Care Physician:  Cassell SmilesFUSCO,LAWRENCE J., MD Primary GI:  Dr. Jena Gaussourk  Chief Complaint  Patient presents with  . Gastroesophageal Reflux    HPI:   [name] is a 60 y.o. female who presents for follow-up on GERD. The patient was last seen in our office 09/26/2014. At that point was noted she has a chronic history of GERD, history of Nissen fundoplication in early 2006 as well as iron deficiency anemia status post EGD and colonoscopy by Dr. Elnoria HowardHung in September 2009 which showed 5 mm hiatal hernia and large hemorrhoids. Capsule study normal. At that time it was also noted she typically does well with over-the-counter brand name Nexium, generic with breakthrough symptoms. Nexium prescription no longer covered by insurance. At her last visit she was taking over-the-counter brand name Nexium and no more Zantac. Only nausea noted with severe heartburn. Has a hard time vomiting due to her history of Nissen.  Next due for colonoscopy in 2019. Recommended continue over-the-counter brand name Nexium and notify us if any worsening symptoms.  The patient was initially scheduled for 12/25/2015 but was a no-show.  Today she states her reflux is getting worse. Has been on OTC name-brand Nexium. PCP placed her on Zantac as well. Trying to improve her symptoms and decide what's going on she stopped taking all her GERD medications. As an aside, recent joint pain improved with stopping medications. She has been having esophageal spasms substernally which tend to last as long as 6 hours. Also with GERD symptoms. GERD symptoms also. Avoiding trigger foods. Symptoms all started getting worse a few months ago. Has tried and now failed Zantac, Nexium, Prilosec, Tagamet. Cannot take Pepcid (allergy). Denies other abdominal pain. Occasional nausea, no vomiting. Has fecal urgency postprandially. Denies hematochezia, melena, fever, chills, unintentional weight loss.  Takes  Excedrin "sometimes quite a bit" sometimes every day. Denies ASA powders. Is also on Diclofenac.  Past Medical History:  Diagnosis Date  . Anemia    reportedly IDA; GIVENS performed by Dr. Elnoria HowardHung Oct 2009, no abnormalities  . GERD (gastroesophageal reflux disease)   . S/P colonoscopy Sept 2009   normal colon, large hemorrhoids  . S/P endoscopy Sept 2009   Dr. Elnoria HowardHung: hiatal hernia    Past Surgical History:  Procedure Laterality Date  . ADENOIDECTOMY    . bilateral foot surgery    . CHOLECYSTECTOMY    . endoscopy and colonoscopy  2009   Dr. Elnoria HowardHung: hiatal hernia, normal colon, large hemorrhoids  . GIVENS CAPSULE STUDY  Oct 2009   Dr. Elnoria HowardHung: normal  . NISSEN FUNDOPLICATION    . NOSE SURGERY    . ROTATOR CUFF REPAIR    . TONSILLECTOMY      Current Outpatient Prescriptions  Medication Sig Dispense Refill  . aspirin-acetaminophen-caffeine (EXCEDRIN MIGRAINE) 250-250-65 MG tablet Take 1 tablet by mouth every 6 (six) hours as needed for headache.    . diclofenac (VOLTAREN) 75 MG EC tablet Take 75 mg by mouth 2 (two) times daily.     Marland Kitchen. esomeprazole (NEXIUM) 40 MG capsule Take 40 mg by mouth daily before breakfast.      . ranitidine (ZANTAC) 300 MG tablet Take 300 mg by mouth at bedtime.    Marland Kitchen. dexlansoprazole (DEXILANT) 60 MG capsule Take 1 capsule (60 mg total) by mouth daily. 15 capsule 0   No current facility-administered medications for this visit.     Allergies as of 12/31/2015 - Review Complete 12/31/2015  Allergen  Reaction Noted  . Bismuth subsalicylate Nausea And Vomiting 01/21/2011  . Codeine Nausea And Vomiting 01/21/2011    Family History  Problem Relation Age of Onset  . Breast cancer Sister   . Colon cancer Neg Hx     Social History   Social History  . Marital status: Widowed    Spouse name: N/A  . Number of children: 0  . Years of education: N/A   Occupational History  .  [employer]   Social History Main Topics  . Smoking status: Never Smoker  .  Smokeless tobacco: Never Used  . Alcohol use No  . Drug use: No  . Sexual activity: Not Asked   Other Topics Concern  . None   Social History Narrative  . None    Review of Systems: General: Negative for anorexia, weight loss, fever, chills, fatigue, weakness. ENT: Negative for hoarseness, difficulty swallowing. CV: Negative for chest pain, angina, palpitations, peripheral edema.  Respiratory: Negative for dyspnea at rest, cough, sputum, wheezing.  GI: See history of present illness. MS: Admits chronic back pain.  Derm: Negative for rash or itching.  Endo: Negative for unusual weight change.  Heme: Negative for bruising or bleeding.   Physical Exam: BP 123/73   Pulse 90   Temp 98.1 F (36.7 C) (Oral)   Ht 5\' 4"  (1.626 m)   Wt 154 lb 12.8 oz (70.2 kg)   BMI 26.57 kg/m  General:   Alert and oriented. Pleasant and cooperative. Well-nourished and well-developed.  Ears:  Normal auditory acuity. Cardiovascular:  S1, S2 present without murmurs appreciated. Extremities without clubbing or edema. Respiratory:  Clear to auscultation bilaterally. No wheezes, rales, or rhonchi. No distress.  Gastrointestinal:  +BS, soft, non-tender and non-distended. No HSM noted. No guarding or rebound. No masses appreciated.  Rectal:  Deferred  Musculoskalatal:  Symmetrical without gross deformities. Neurologic:  Alert and oriented x4;  grossly normal neurologically. Psych:  Alert and cooperative. Normal mood and affect. Heme/Lymph/Immune: No excessive bruising noted.    12/31/2015 9:45 AM   Disclaimer: This note was dictated with voice recognition software. Similar sounding words can inadvertently be transcribed and may not be corrected upon review.

## 2015-12-31 NOTE — Telephone Encounter (Signed)
Pt was seen in the office today. I was giving her the discharge instructions and informed her that she would need to be off of her ppi for 2 weeks prior to having hpylori breath test done at [facility] lab. Pt stated there was no way she could be off of her ppi for 2 weeks. Advised her that in order for the test to be accurate she would need to stop them for 2 weeks before having the test done. Pt stated there was no way she could do that and may not have the test done because of it. I advised her that it was her choice to have it done or not.

## 2015-12-31 NOTE — Patient Instructions (Signed)
1. We recommend avoiding NSAID medications. This includes multiple air and, low back, Excedrin, naproxen, Naprosyn, Advil, Aleve, ibuprofen, and any other medication with "NSAID" on the bottle. 2. Avoid trigger foods that make your GERD symptoms worse. 3. Have the H. pylori breath test completed at the lab. He should be off your acid blockers for 2 weeks prior to the test for the most accurate results. 4. We are giving you samples of Dexilant. Take 1 a day, on an empty stomach. Call with a progress report in 2 weeks and let us know if it is helping. 5. Return for follow-up in 2 months.

## 2015-12-31 NOTE — Assessment & Plan Note (Signed)
The patient admits recurrent GERD symptoms over the past few months. Previously well controlled on Nexium. She is tried multiple options including Zantac, Nexium, Prilosec, Tagamet. She cannot take Pepcid due to allergy. She is taking significant amounts of NSAIDs including prescription vault Errin as needed as well as Excedrin up to 1 or 2 a day. I discussed the risks of taking significant amounts of NSAIDs given her uncontrolled GERD including gastritis, esophagitis, peptic ulcer disease. She states "It is the only thing I can take." She is apprised of the risks.  At this point I'll complete a hydrogen breath test to check for H. pylori infection, she will need to be off PPIs for 2 weeks prior. I will also start her on Dexilant samples to start taking after the breath test. Return for follow-up in 2 months.

## 2015-12-31 NOTE — Progress Notes (Signed)
CC'D TO PCP °

## 2016-01-01 DIAGNOSIS — M25512 Pain in left shoulder: Secondary | ICD-10-CM | POA: Diagnosis not present

## 2016-01-01 NOTE — Telephone Encounter (Signed)
Noted, no further recommendations. 

## 2016-01-02 DIAGNOSIS — M9902 Segmental and somatic dysfunction of thoracic region: Secondary | ICD-10-CM | POA: Diagnosis not present

## 2016-01-02 DIAGNOSIS — M9901 Segmental and somatic dysfunction of cervical region: Secondary | ICD-10-CM | POA: Diagnosis not present

## 2016-01-02 DIAGNOSIS — H8111 Benign paroxysmal vertigo, right ear: Secondary | ICD-10-CM | POA: Diagnosis not present

## 2016-01-02 DIAGNOSIS — M542 Cervicalgia: Secondary | ICD-10-CM | POA: Diagnosis not present

## 2016-01-06 DIAGNOSIS — M9902 Segmental and somatic dysfunction of thoracic region: Secondary | ICD-10-CM | POA: Diagnosis not present

## 2016-01-06 DIAGNOSIS — M9901 Segmental and somatic dysfunction of cervical region: Secondary | ICD-10-CM | POA: Diagnosis not present

## 2016-01-06 DIAGNOSIS — H8111 Benign paroxysmal vertigo, right ear: Secondary | ICD-10-CM | POA: Diagnosis not present

## 2016-01-06 DIAGNOSIS — M542 Cervicalgia: Secondary | ICD-10-CM | POA: Diagnosis not present

## 2016-01-08 DIAGNOSIS — H8111 Benign paroxysmal vertigo, right ear: Secondary | ICD-10-CM | POA: Diagnosis not present

## 2016-01-08 DIAGNOSIS — M542 Cervicalgia: Secondary | ICD-10-CM | POA: Diagnosis not present

## 2016-01-08 DIAGNOSIS — M9902 Segmental and somatic dysfunction of thoracic region: Secondary | ICD-10-CM | POA: Diagnosis not present

## 2016-01-08 DIAGNOSIS — M9901 Segmental and somatic dysfunction of cervical region: Secondary | ICD-10-CM | POA: Diagnosis not present

## 2016-01-10 DIAGNOSIS — M9902 Segmental and somatic dysfunction of thoracic region: Secondary | ICD-10-CM | POA: Diagnosis not present

## 2016-01-10 DIAGNOSIS — H8111 Benign paroxysmal vertigo, right ear: Secondary | ICD-10-CM | POA: Diagnosis not present

## 2016-01-10 DIAGNOSIS — M542 Cervicalgia: Secondary | ICD-10-CM | POA: Diagnosis not present

## 2016-01-10 DIAGNOSIS — M9901 Segmental and somatic dysfunction of cervical region: Secondary | ICD-10-CM | POA: Diagnosis not present

## 2016-01-13 ENCOUNTER — Telehealth: Payer: Self-pay | Admitting: Internal Medicine

## 2016-01-13 DIAGNOSIS — M542 Cervicalgia: Secondary | ICD-10-CM | POA: Diagnosis not present

## 2016-01-13 DIAGNOSIS — H8111 Benign paroxysmal vertigo, right ear: Secondary | ICD-10-CM | POA: Diagnosis not present

## 2016-01-13 DIAGNOSIS — K219 Gastro-esophageal reflux disease without esophagitis: Secondary | ICD-10-CM

## 2016-01-13 DIAGNOSIS — M9901 Segmental and somatic dysfunction of cervical region: Secondary | ICD-10-CM | POA: Diagnosis not present

## 2016-01-13 DIAGNOSIS — M9902 Segmental and somatic dysfunction of thoracic region: Secondary | ICD-10-CM | POA: Diagnosis not present

## 2016-01-13 MED ORDER — DEXLANSOPRAZOLE 60 MG PO CPDR: 60.0000 mg | DELAYED_RELEASE_CAPSULE | Freq: Every day | ORAL | 2 refills | Status: DC

## 2016-01-13 NOTE — Telephone Encounter (Signed)
Pt called to see if we would call in a prescription of Dexilant to [facility] in [place].

## 2016-01-13 NOTE — Telephone Encounter (Signed)
Rx sent to the pharmacy, please notify the patient.

## 2016-01-13 NOTE — Telephone Encounter (Signed)
Routing to EG to send in rx. Pt was given samples at her ov.

## 2016-01-14 NOTE — Telephone Encounter (Signed)
Received a fax from the pharmacy that pt needed a PA for dexilant-  Tried to do a PA- got a message from [employer] that Dexilant is no longer covered for this plan. Plan covers: rabeprazole, esomeprazole, lansoprazole, pantoprazole and generic zegerid.  Pt will need to change to one of these.

## 2016-01-15 ENCOUNTER — Telehealth: Payer: Self-pay | Admitting: Internal Medicine

## 2016-01-15 DIAGNOSIS — M542 Cervicalgia: Secondary | ICD-10-CM | POA: Diagnosis not present

## 2016-01-15 DIAGNOSIS — M9901 Segmental and somatic dysfunction of cervical region: Secondary | ICD-10-CM | POA: Diagnosis not present

## 2016-01-15 DIAGNOSIS — M9902 Segmental and somatic dysfunction of thoracic region: Secondary | ICD-10-CM | POA: Diagnosis not present

## 2016-01-15 DIAGNOSIS — H8111 Benign paroxysmal vertigo, right ear: Secondary | ICD-10-CM | POA: Diagnosis not present

## 2016-01-15 MED ORDER — RABEPRAZOLE SODIUM 20 MG PO TBEC: 20.0000 mg | DELAYED_RELEASE_TABLET | Freq: Every day | ORAL | 11 refills | Status: DC

## 2016-01-15 NOTE — Telephone Encounter (Signed)
Please notify the patient of insurance decision. If she is agreeable please send in Aciphex 20 mg daily

## 2016-01-15 NOTE — Telephone Encounter (Signed)
Pt is aware and she [facility]. I have sent in an rx.

## 2016-01-15 NOTE — Telephone Encounter (Signed)
See other phone note

## 2016-01-15 NOTE — Telephone Encounter (Signed)
Pt said that her dexilant prescription should have went to [facility] in [place] because the [facility] in [place] was closing.

## 2016-01-15 NOTE — Addendum Note (Signed)
Addended by: Myra RudeLAWSON, Duke Weisensel H on: 01/15/2016 01:37 PM   Modules accepted: Orders

## 2016-01-17 DIAGNOSIS — H8111 Benign paroxysmal vertigo, right ear: Secondary | ICD-10-CM | POA: Diagnosis not present

## 2016-01-17 DIAGNOSIS — M9902 Segmental and somatic dysfunction of thoracic region: Secondary | ICD-10-CM | POA: Diagnosis not present

## 2016-01-17 DIAGNOSIS — M9901 Segmental and somatic dysfunction of cervical region: Secondary | ICD-10-CM | POA: Diagnosis not present

## 2016-01-17 DIAGNOSIS — M542 Cervicalgia: Secondary | ICD-10-CM | POA: Diagnosis not present

## 2016-01-20 DIAGNOSIS — H8111 Benign paroxysmal vertigo, right ear: Secondary | ICD-10-CM | POA: Diagnosis not present

## 2016-01-20 DIAGNOSIS — M9902 Segmental and somatic dysfunction of thoracic region: Secondary | ICD-10-CM | POA: Diagnosis not present

## 2016-01-20 DIAGNOSIS — M9901 Segmental and somatic dysfunction of cervical region: Secondary | ICD-10-CM | POA: Diagnosis not present

## 2016-01-20 DIAGNOSIS — M79601 Pain in right arm: Secondary | ICD-10-CM | POA: Diagnosis not present

## 2016-01-20 DIAGNOSIS — R200 Anesthesia of skin: Secondary | ICD-10-CM | POA: Diagnosis not present

## 2016-01-20 DIAGNOSIS — M542 Cervicalgia: Secondary | ICD-10-CM | POA: Diagnosis not present

## 2016-02-05 ENCOUNTER — Other Ambulatory Visit: Payer: Self-pay | Admitting: Orthopedic Surgery

## 2016-02-05 DIAGNOSIS — G5601 Carpal tunnel syndrome, right upper limb: Secondary | ICD-10-CM | POA: Diagnosis not present

## 2016-02-06 DIAGNOSIS — S46192D Other injury of muscle, fascia and tendon of long head of biceps, left arm, subsequent encounter: Secondary | ICD-10-CM | POA: Diagnosis not present

## 2016-02-06 DIAGNOSIS — M66812 Spontaneous rupture of other tendons, left shoulder: Secondary | ICD-10-CM | POA: Diagnosis not present

## 2016-02-06 DIAGNOSIS — M75122 Complete rotator cuff tear or rupture of left shoulder, not specified as traumatic: Secondary | ICD-10-CM | POA: Diagnosis not present

## 2016-02-06 DIAGNOSIS — M24112 Other articular cartilage disorders, left shoulder: Secondary | ICD-10-CM | POA: Diagnosis not present

## 2016-02-06 DIAGNOSIS — G8918 Other acute postprocedural pain: Secondary | ICD-10-CM | POA: Diagnosis not present

## 2016-02-14 DIAGNOSIS — M24112 Other articular cartilage disorders, left shoulder: Secondary | ICD-10-CM | POA: Diagnosis not present

## 2016-02-18 ENCOUNTER — Ambulatory Visit (INDEPENDENT_AMBULATORY_CARE_PROVIDER_SITE_OTHER): Payer: Federal, State, Local not specified - PPO | Admitting: Neurology

## 2016-02-18 ENCOUNTER — Encounter: Payer: Self-pay | Admitting: Neurology

## 2016-02-18 VITALS — BP 121/76 | HR 79 | Ht 64.0 in | Wt 156.5 lb

## 2016-02-18 DIAGNOSIS — R42 Dizziness and giddiness: Secondary | ICD-10-CM | POA: Insufficient documentation

## 2016-02-18 DIAGNOSIS — R29898 Other symptoms and signs involving the musculoskeletal system: Secondary | ICD-10-CM | POA: Insufficient documentation

## 2016-02-18 DIAGNOSIS — M542 Cervicalgia: Secondary | ICD-10-CM | POA: Insufficient documentation

## 2016-02-18 NOTE — Progress Notes (Signed)
PATIENT: [name] DOB: 25-Jan-1956  Chief Complaint  Patient presents with  . Chronic Vertigo    Orhthostatic Vitals: Lying:. Sitting:, Standing:.  Reports a neck injury from having a fall two years ago.  Since this time, she has had episodes of dizziness since the accident. Symptoms are made worse with positinal changes.  Recently, she has had increased dizziness and went to a chiropractor.  Feels symptoms have worsened since having neck adjustments, three times weekly for one month.  . Chiropractic Medicine    Reatha Harps, DC - referring office  . PCP    Elfredia Nevins, MD     HISTORICAL  [name] is a 61 years old right-handed female, seen in refer by chiropractor Reatha Harps for evaluation of dizziness, her primary care physician is Dr. Elfredia Nevins, initial evaluation was on February 18 2016.  I reviewed and summarized the referring note, she had a history of anemia, she had dizziness since she fell in November 2015, when she pick up her mail, she fell on her pavement at the right vortex region, with skin abrasion, black and blue eyes. But no loss of consciousness.  Since the event, she had transient vertigo, triggered by sudden positional change, she had really positional maneuver, which showed significant improvement,  Since summer of 2017, she had recurrent dizziness again, if she lying down especially to her right side, she has transient eye jumping, spinning sensation, improved after holding still, also with sudden positional change she has transient vertigo, she also complains of chronic neck pain, taking diclofenac as needed,  She denies significant current hearing loss, in December 2017, because of her recurrent neck pain, vertigo, she was evaluated and had neck manipulation by chiropractor, which did help her neck pain, but she denies significant improvement of her vertigo,  She reported a history of left arm weakness left hand weakness due to  previous left arm repetitive movement, had left forearm decompression surgery by hand surgery in the past, she also reported a history of right carpal tunnel syndromes.  REVIEW OF SYSTEMS: Full 14 system review of systems performed and notable only for fatigue, spinning sensation, easy bruising, feeling hot, feeling cold, increased thirst, joint pain, achy muscles, allergy, memory loss, headaches, dizziness, anxiety, decreased energy, disinterested in activities.  ALLERGIES: Allergies  Allergen Reactions  . Bismuth Subsalicylate Nausea And Vomiting  . Codeine Nausea And Vomiting    HOME MEDICATIONS: Current Outpatient Prescriptions  Medication Sig Dispense Refill  . ALPHA LIPOIC ACID PO Take by mouth daily.    Marland Kitchen aspirin-acetaminophen-caffeine (EXCEDRIN MIGRAINE) 250-250-65 MG tablet Take 1 tablet by mouth every 6 (six) hours as needed for headache.    . dexlansoprazole (DEXILANT) 60 MG capsule Take 1 capsule (60 mg total) by mouth daily. 30 capsule 2  . diclofenac (VOLTAREN) 75 MG EC tablet Take 75 mg by mouth 2 (two) times daily.     . DULoxetine (CYMBALTA) 20 MG capsule daily.    Marland Kitchen esomeprazole (NEXIUM) 40 MG capsule Take 40 mg by mouth daily before breakfast.      . gabapentin (NEURONTIN) 100 MG capsule Takes 1 capsule BID.    Marland Kitchen Nutritional Supplements (DHEA PO) Take by mouth daily.    . RABEprazole (ACIPHEX) 20 MG tablet Take 1 tablet (20 mg total) by mouth daily. 30 tablet 11  . ranitidine (ZANTAC) 300 MG tablet Take 300 mg by mouth at bedtime.     No current facility-administered medications for this visit.  PAST MEDICAL HISTORY: Past Medical History:  Diagnosis Date  . Anemia    reportedly IDA; GIVENS performed by Dr. Elnoria HowardHung Oct 2009, no abnormalities  . Carpal tunnel syndrome   . Dizziness   . GERD (gastroesophageal reflux disease)   . S/P colonoscopy Sept 2009   normal colon, large hemorrhoids  . S/P endoscopy Sept 2009   Dr. Elnoria HowardHung: hiatal hernia  . Vertigo      PAST SURGICAL HISTORY: Past Surgical History:  Procedure Laterality Date  . ADENOIDECTOMY    . bilateral foot surgery    . CHOLECYSTECTOMY    . endoscopy and colonoscopy  2009   Dr. Elnoria HowardHung: hiatal hernia, normal colon, large hemorrhoids  . GIVENS CAPSULE STUDY  Oct 2009   Dr. Elnoria HowardHung: normal  . KNEE SURGERY Right   . NISSEN FUNDOPLICATION    . NOSE SURGERY    . ROTATOR CUFF REPAIR    . TONSILLECTOMY      FAMILY HISTORY: Family History  Problem Relation Age of Onset  . Breast cancer Sister   . Diabetes Father   . Cervical cancer Maternal Grandmother   . Stroke Maternal Grandfather   . Heart attack Paternal Grandfather   . Colon cancer Neg Hx     SOCIAL HISTORY:  Social History   Social History  . Marital status: Widowed    Spouse name: N/A  . Number of children: 0  . Years of education: 14   Occupational History  . Retired [employer]   Social History Main Topics  . Smoking status: Former Games developermoker  . Smokeless tobacco: Never Used     Comment: Quit 2000  . Alcohol use No  . Drug use: No  . Sexual activity: Not on file   Other Topics Concern  . Not on file   Social History Narrative   Lives at home with husband.   Right-handed.   No caffeine use.     PHYSICAL EXAM   Vitals:   02/18/16 1134  Weight: 156 lb 8 oz (71 kg)  Height: 5\' 4"  (1.626 m)    Not recorded      Body mass index is 26.86 kg/m.  PHYSICAL EXAMNIATION:  Gen: NAD, conversant, well nourised, obese, well groomed                     Cardiovascular: Regular rate rhythm, no peripheral edema, warm, nontender. Eyes: Conjunctivae clear without exudates or hemorrhage Neck: Supple, no carotid bruits. Pulmonary: Clear to auscultation bilaterally   NEUROLOGICAL EXAM:  MENTAL STATUS: Speech:    Speech is normal; fluent and spontaneous with normal comprehension.  Cognition:     Orientation to time, place and person     Normal recent and remote memory     Normal Attention span and  concentration     Normal Language, naming, repeating,spontaneous speech     Fund of knowledge   CRANIAL NERVES: CN II: Visual fields are full to confrontation. Fundoscopic exam is normal with sharp discs and no vascular changes. Pupils are round equal and briskly reactive to light. CN III, IV, VI: extraocular movement are normal. No ptosis. CN V: Facial sensation is intact to pinprick in all 3 divisions bilaterally. Corneal responses are intact.  CN VII: Face is symmetric with normal eye closure and smile. CN VIII: Hearing is normal to rubbing fingers CN IX, X: Palate elevates symmetrically. Phonation is normal. CN XI: Head turning and shoulder shrug are intact CN XII: Tongue is midline  with normal movements and no atrophy.  MOTOR: Left forearm vertical well-healed scars, she has mild bilateral hand intrinsic muscle atrophy, left worse than right, also including left abductor pollicis brevis, mild left wrist flexion, finger flexion, finger abduction, grip weakness, mild right finger abduction weakness. No significant proximal arm weakness noticed.   REFLEXES: Reflexes are 2+ and symmetric at the biceps, triceps, knees, and ankles. Plantar responses are flexor.  SENSORY: Decreased light touch pinprick at bilateral first 3 fingerpads.   COORDINATION: Rapid alternating movements and fine finger movements are intact. There is no dysmetria on finger-to-nose and heel-knee-shin.    GAIT/STANCE: Posture is normal. Gait is steady with normal steps, base, arm swing, and turning. Heel and toe walking are normal. Tandem gait is normal.  Romberg is absent.   DIAGNOSTIC DATA (LABS, IMAGING, TESTING) - I reviewed patient records, labs, notes, testing and imaging myself where available.   ASSESSMENT AND PLAN  BRIELE LAGASSE is a 61 y.o. female   Neck pain, bilateral hands atrophy, paresthesia,  Potential localization to cervical spine,  Proceed with MRI of cervical  Continue Cymbalta 60  mg daily, gabapentin 100 mg 3 times a day   Levert Feinstein, M.D. Ph.D.  [facility] [address] [place], [place] [address] Ph: [phone] Fax: [phone]  CC: Referring Provider

## 2016-02-26 ENCOUNTER — Ambulatory Visit (INDEPENDENT_AMBULATORY_CARE_PROVIDER_SITE_OTHER): Payer: Federal, State, Local not specified - PPO

## 2016-02-26 DIAGNOSIS — R42 Dizziness and giddiness: Secondary | ICD-10-CM | POA: Diagnosis not present

## 2016-02-26 DIAGNOSIS — M542 Cervicalgia: Secondary | ICD-10-CM | POA: Diagnosis not present

## 2016-02-26 DIAGNOSIS — R29898 Other symptoms and signs involving the musculoskeletal system: Secondary | ICD-10-CM | POA: Diagnosis not present

## 2016-03-02 ENCOUNTER — Ambulatory Visit (INDEPENDENT_AMBULATORY_CARE_PROVIDER_SITE_OTHER): Payer: Federal, State, Local not specified - PPO | Admitting: Gastroenterology

## 2016-03-02 ENCOUNTER — Telehealth: Payer: Self-pay | Admitting: Neurology

## 2016-03-02 ENCOUNTER — Encounter: Payer: Self-pay | Admitting: Gastroenterology

## 2016-03-02 DIAGNOSIS — K580 Irritable bowel syndrome with diarrhea: Secondary | ICD-10-CM

## 2016-03-02 DIAGNOSIS — R1013 Epigastric pain: Secondary | ICD-10-CM | POA: Diagnosis not present

## 2016-03-02 DIAGNOSIS — K589 Irritable bowel syndrome without diarrhea: Secondary | ICD-10-CM | POA: Insufficient documentation

## 2016-03-02 MED ORDER — DICYCLOMINE HCL 10 MG PO CAPS: 10.0000 mg | ORAL_CAPSULE | Freq: Three times a day (TID) | ORAL | 3 refills | Status: DC

## 2016-03-02 MED ORDER — RABEPRAZOLE SODIUM 20 MG PO TBEC: 20.0000 mg | DELAYED_RELEASE_TABLET | Freq: Two times a day (BID) | ORAL | 3 refills | Status: DC

## 2016-03-02 NOTE — Telephone Encounter (Signed)
Elon JesterMichele, please discuss the following with patient. MRI cervical spine showed mild degenerative (arthritic) changes at several levels in the cervical spine however there appears to be no nerve root pinching and the cervical spine is healthy which is great news. Arthritis in the neck can cause musculoskeletal neck pain though. Continue with Cymbalta and gabapentin. I will cc Dr. Terrace ArabiaYan on this so she can follow up when she gets back thanks.      MRI cervical spine: FINDINGS: :  On sagittal images, the spine is imaged from above the cervicomedullary junction to T2.   The spinal cord is of normal caliber and signal.   The vertebral bodies are normally aligned.   The vertebral bodies have normal signal.  The discs and interspaces were further evaluated on axial views from C2 to T1 as follows: C2 - C3:  There is minimal disc bulging. The neural foramina are not narrowed and there is no nerve root C3 - C4:  There is bilateral uncovertebral spurring and disc protrusion and mild right facet hypertrophy. These combined to cause mild spinal stenosis and mild right foraminal narrowing. There is no nerve root compression. C4 - C5:  There is mild right uncovertebral spurring. There is mild right foraminal narrowing but no nerve root compression. C5 - C6:  There is mild right uncovertebral spurring. There is no significant foraminal narrowing and no nerve root compression.. C6 - C7:  There is a small central disc protrusion causing mild spinal stenosis. The neural foramina are not significantly narrowed and there is no nerve root compression. C7 - T1:  The disc and interspace appear normal.   IMPRESSION:  This MRI of the cervical spine without contrast shows the following: 1.    There is mild spinal stenosis at C3-C4 and C6-C7. 2.    There does not appear to be any nerve root compression.

## 2016-03-02 NOTE — Patient Instructions (Signed)
I have increased Aciphex to twice a day, 30 minutes before breakfast and dinner.   For abdominal cramping and frequent stool, trial of Bentyl 30 minutes before breakfast and dinner; however, you can increase this up to 4 times a day.   We will see you in 3 months.

## 2016-03-02 NOTE — Assessment & Plan Note (Addendum)
Diarrhea-predominant. Trial of Bentyl. Next colonoscopy in 2019; patient hesitant to pursue colonoscopy in future but we will discuss that again as it nears.

## 2016-03-02 NOTE — Assessment & Plan Note (Signed)
Vague epigastric discomfort in setting of NSAIDs, excedrin but has improved with Aciphex daily. No dysphagia or alarm symptoms. Increase to BID. If no significant improvement or worsening, proceed with EGD. 3 month return.

## 2016-03-02 NOTE — Progress Notes (Signed)
Referring Provider: Elfredia Nevins, MD Primary Care Physician:  Cassell Smiles., MD Primary GI: Dr. Jena Gauss   Chief Complaint  Patient presents with  . Gastroesophageal Reflux    better since started Aciphex; takes Ranitidine prn    HPI:   [name] is a 61 y.o. female presenting today with a history of GERD, IDA in remote past, due for colonoscopy in 2019. Tried/failed Zantac, Nexium, Prilosec, Tagamet. Has been taking NSAIDs, excedrin. Dexilant samples provided at last visit.   Has postprandial urgency. Has gas, discomfort, bloating. Chronic. Bristol scale #4-7. Probiotics haven't seemed to really help. Dexilant wasn't covered. On Aciphex. Taking excedrin for headaches. Taking diclofenac. No dysphagia. Avoiding lactose foods did not help. Hasn't found any specific foods that trigger. Just doesn't feel good in abdomen. Nauseated occasionally. Will take Zofran for severe nausea but states "this is just typical". Unable to do the urea breath test as she can't come off of a PPI. Also notes epigastric discomfort over the past few months but seems better with Aciphex. Almost felt like a fluttering in her upper abdomen. Not taking duloxetine or gabapentin, as the duloxetine made dyspepsia worse.   Past Medical History:  Diagnosis Date  . Anemia    reportedly IDA; GIVENS performed by Dr. Elnoria Howard Oct 2009, no abnormalities  . Carpal tunnel syndrome   . Dizziness   . GERD (gastroesophageal reflux disease)   . S/P colonoscopy Sept 2009   normal colon, large hemorrhoids  . S/P endoscopy Sept 2009   Dr. Elnoria Howard: hiatal hernia  . Vertigo     Past Surgical History:  Procedure Laterality Date  . ADENOIDECTOMY    . bilateral foot surgery    . CHOLECYSTECTOMY    . endoscopy and colonoscopy  2009   Dr. Elnoria Howard: hiatal hernia, normal colon, large hemorrhoids  . GIVENS CAPSULE STUDY  Oct 2009   Dr. Elnoria Howard: normal  . KNEE SURGERY Right   . NISSEN FUNDOPLICATION    . NOSE SURGERY    .  ROTATOR CUFF REPAIR    . TONSILLECTOMY      Current Outpatient Prescriptions  Medication Sig Dispense Refill  . ALPHA LIPOIC ACID PO Take by mouth daily.    Marland Kitchen aspirin-acetaminophen-caffeine (EXCEDRIN MIGRAINE) 250-250-65 MG tablet Take 1 tablet by mouth every 6 (six) hours as needed for headache.    . diclofenac (VOLTAREN) 75 MG EC tablet Take 75 mg by mouth 2 (two) times daily.     . Nutritional Supplements (DHEA PO) Take by mouth daily.    . RABEprazole (ACIPHEX) 20 MG tablet Take 1 tablet (20 mg total) by mouth daily. 30 tablet 11  . ranitidine (ZANTAC) 300 MG tablet Take 300 mg by mouth at bedtime.    Marland Kitchen dexlansoprazole (DEXILANT) 60 MG capsule Take 1 capsule (60 mg total) by mouth daily. (Patient not taking: Reported on 03/02/2016) 30 capsule 2  . DULoxetine (CYMBALTA) 20 MG capsule daily.    Marland Kitchen esomeprazole (NEXIUM) 40 MG capsule Take 40 mg by mouth daily before breakfast.      . gabapentin (NEURONTIN) 100 MG capsule Takes 1 capsule BID.     No current facility-administered medications for this visit.     Allergies as of 03/02/2016 - Review Complete 03/02/2016  Allergen Reaction Noted  . Bismuth subsalicylate Nausea And Vomiting 01/21/2011  . Codeine Nausea And Vomiting 01/21/2011    Family History  Problem Relation Age of Onset  . Breast cancer Sister   . Diabetes Father   .  Cervical cancer Maternal Grandmother   . Stroke Maternal Grandfather   . Heart attack Paternal Grandfather   . Colon cancer Neg Hx     Social History   Social History  . Marital status: Widowed    Spouse name: N/A  . Number of children: 0  . Years of education: 14   Occupational History  . Retired [employer]   Social History Main Topics  . Smoking status: Former Games developermoker  . Smokeless tobacco: Never Used     Comment: Quit 2000  . Alcohol use No  . Drug use: No  . Sexual activity: Not Asked   Other Topics Concern  . None   Social History Narrative   Lives at home with husband.    Right-handed.   No caffeine use.    Review of Systems: As mentioned in HPI   Physical Exam: BP 136/84   Pulse 92   Temp 98 F (36.7 C) (Oral)   Ht 5\' 4"  (1.626 m)   Wt 160 lb 9.6 oz (72.8 kg)   BMI 27.57 kg/m  General:   Alert and oriented. No distress noted. Pleasant and cooperative.  Head:  Normocephalic and atraumatic. Eyes:  Conjuctiva clear without scleral icterus. Abdomen:  +BS, soft, mild tenderness with palpation epigastric and non-distended. No rebound or guarding. No HSM or masses noted. Msk:  Symmetrical without gross deformities. Normal posture. Extremities:  Without edema. Neurologic:  Alert and  oriented x4;  grossly normal neurologically. Psych:  Alert and cooperative. Normal mood and affect.

## 2016-03-03 ENCOUNTER — Encounter: Payer: Self-pay | Admitting: Internal Medicine

## 2016-03-03 NOTE — Telephone Encounter (Signed)
Patient called returning nurse's call. Please call

## 2016-03-03 NOTE — Telephone Encounter (Signed)
Patient aware of results and will keep her pending appt for further discussion.

## 2016-03-03 NOTE — Progress Notes (Signed)
cc'ed to pcp °

## 2016-03-03 NOTE — Telephone Encounter (Signed)
Returned call & had to leave another message

## 2016-03-03 NOTE — Telephone Encounter (Signed)
Left another message for a return call

## 2016-03-03 NOTE — Telephone Encounter (Signed)
Left message for a return call

## 2016-03-09 NOTE — Progress Notes (Signed)
Received outside labs dated Sept 2017:   Hgb 13.8, Hct 41.3, Platelets 259, BUN 12, Cr 0.59, Tbili 0.4, Alk Phos 101, AST 15, ALT 19, TSH 1.760.

## 2016-03-13 DIAGNOSIS — M25512 Pain in left shoulder: Secondary | ICD-10-CM | POA: Diagnosis not present

## 2016-03-13 DIAGNOSIS — M24112 Other articular cartilage disorders, left shoulder: Secondary | ICD-10-CM | POA: Diagnosis not present

## 2016-03-23 ENCOUNTER — Ambulatory Visit (INDEPENDENT_AMBULATORY_CARE_PROVIDER_SITE_OTHER): Payer: Federal, State, Local not specified - PPO | Admitting: Neurology

## 2016-03-23 ENCOUNTER — Encounter: Payer: Self-pay | Admitting: Neurology

## 2016-03-23 VITALS — BP 120/78 | HR 80 | Ht 64.0 in | Wt 158.8 lb

## 2016-03-23 DIAGNOSIS — R42 Dizziness and giddiness: Secondary | ICD-10-CM | POA: Diagnosis not present

## 2016-03-23 DIAGNOSIS — M542 Cervicalgia: Secondary | ICD-10-CM | POA: Diagnosis not present

## 2016-03-23 DIAGNOSIS — H9213 Otorrhea, bilateral: Secondary | ICD-10-CM

## 2016-03-23 NOTE — Progress Notes (Signed)
PATIENT: [name] DOB: Jun 01, 1955  Chief Complaint  Patient presents with  . Neck Pain    She would like to review her cervical MRI results.  She is still taking duloxetine 60mg .  She stopped gabapentin but plans to restart the medication.  . Dizziness    Symptoms unchanged and still occur intermittently.     HISTORICAL  [name] is a 61 years old right-handed female, seen in refer by chiropractor Reatha Harps for evaluation of dizziness, her primary care physician is Dr. Elfredia Nevins, initial evaluation was on February 18 2016.  I reviewed and summarized the referring note, she had a history of anemia, she had dizziness since she fell in November 2015, when she pick up her mail, she fell on her pavement at the right vortex region, with skin abrasion, black and blue eyes. But no loss of consciousness.  Since the event, she had transient vertigo, triggered by sudden positional change, she had really positional maneuver, which showed significant improvement,  Since summer of 2017, she had recurrent dizziness again, if she lying down especially to her right side, she has transient eye jumping, spinning sensation, improved after holding still, also with sudden positional change she has transient vertigo, she also complains of chronic neck pain, taking diclofenac as needed,  She denies significant current hearing loss, in December 2017, because of her recurrent neck pain, vertigo, she was evaluated and had neck manipulation by chiropractor, which did help her neck pain, but she denies significant improvement of her vertigo,  She reported a history of left arm weakness left hand weakness due to previous left arm repetitive movement, had left forearm decompression surgery by hand surgery in the past, she also reported a history of right carpal tunnel syndromes.  UPDATE Mar 23 2016: We have personally reviewed MRI cervical in January 2018, multilevel degenerative disc  diseasesignificant foraminal canal stenosis,  On further questioning, she reported dizziness since January 2016, this was 2 months after she fell landed on her right parietal region, she had bilateral ear drainage, was seen by ENT, had ear drop, treated with antibiotics,  After she recovered from ear infection, she began to have vertigo, this can be triggered lying on the right side backwards. She denies tinnitus, no hearing loss   REVIEW OF SYSTEMS: Full 14 system review of systems performed and notable only for light sensitivity, joint pain, low back pain, achy muscles, bruise easily, dizziness headaches, numbness,  ALLERGIES: Allergies  Allergen Reactions  . Bismuth Subsalicylate Nausea And Vomiting  . Codeine Nausea And Vomiting    HOME MEDICATIONS: Current Outpatient Prescriptions  Medication Sig Dispense Refill  . ALPHA LIPOIC ACID PO Take by mouth daily.    Marland Kitchen aspirin-acetaminophen-caffeine (EXCEDRIN MIGRAINE) 250-250-65 MG tablet Take 1 tablet by mouth every 6 (six) hours as needed for headache.    . diclofenac (VOLTAREN) 75 MG EC tablet Take 75 mg by mouth 2 (two) times daily.     Marland Kitchen dicyclomine (BENTYL) 10 MG capsule Take 1 capsule (10 mg total) by mouth 4 (four) times daily -  before meals and at bedtime. 120 capsule 3  . DULoxetine (CYMBALTA) 60 MG capsule Take 60 mg by mouth daily.    Marland Kitchen gabapentin (NEURONTIN) 100 MG capsule Take 100 mg by mouth 3 (three) times daily.    . Nutritional Supplements (DHEA PO) Take by mouth daily.    . RABEprazole (ACIPHEX) 20 MG tablet Take 1 tablet (20 mg total) by mouth 2 (  two) times daily before a meal. 60 tablet 3  . ranitidine (ZANTAC) 300 MG tablet Take 300 mg by mouth at bedtime.     No current facility-administered medications for this visit.     PAST MEDICAL HISTORY: Past Medical History:  Diagnosis Date  . Anemia    reportedly IDA; GIVENS performed by Dr. Elnoria Howard Oct 2009, no abnormalities  . Carpal tunnel syndrome   . Dizziness   .  GERD (gastroesophageal reflux disease)   . S/P colonoscopy Sept 2009   normal colon, large hemorrhoids  . S/P endoscopy Sept 2009   Dr. Elnoria Howard: hiatal hernia  . Vertigo     PAST SURGICAL HISTORY: Past Surgical History:  Procedure Laterality Date  . ADENOIDECTOMY    . bilateral foot surgery    . CHOLECYSTECTOMY    . endoscopy and colonoscopy  2009   Dr. Elnoria Howard: hiatal hernia, normal colon, large hemorrhoids  . GIVENS CAPSULE STUDY  Oct 2009   Dr. Elnoria Howard: normal  . KNEE SURGERY Right   . NISSEN FUNDOPLICATION    . NOSE SURGERY    . ROTATOR CUFF REPAIR    . TONSILLECTOMY      FAMILY HISTORY: Family History  Problem Relation Age of Onset  . Breast cancer Sister   . Diabetes Father   . Cervical cancer Maternal Grandmother   . Stroke Maternal Grandfather   . Heart attack Paternal Grandfather   . Colon cancer Neg Hx     SOCIAL HISTORY:  Social History   Social History  . Marital status: Widowed    Spouse name: N/A  . Number of children: 0  . Years of education: 14   Occupational History  . Retired [employer]   Social History Main Topics  . Smoking status: Former Games developer  . Smokeless tobacco: Never Used     Comment: Quit 2000  . Alcohol use No  . Drug use: No  . Sexual activity: Not on file   Other Topics Concern  . Not on file   Social History Narrative   Lives at home with husband.   Right-handed.   No caffeine use.     PHYSICAL EXAM   Vitals:   03/23/16 1026  BP: 120/78  Pulse: 80  Weight: 158 lb 12 oz (72 kg)  Height: 5\' 4"  (1.626 m)    Not recorded      Body mass index is 27.25 kg/m.  PHYSICAL EXAMNIATION:  Gen: NAD, conversant, well nourised, obese, well groomed                     Cardiovascular: Regular rate rhythm, no peripheral edema, warm, nontender. Eyes: Conjunctivae clear without exudates or hemorrhage Neck: Supple, no carotid bruits. Pulmonary: Clear to auscultation bilaterally   NEUROLOGICAL EXAM:  MENTAL  STATUS: Speech:    Speech is normal; fluent and spontaneous with normal comprehension.  Cognition:     Orientation to time, place and person     Normal recent and remote memory     Normal Attention span and concentration     Normal Language, naming, repeating,spontaneous speech     Fund of knowledge   CRANIAL NERVES: CN II: Visual fields are full to confrontation. Fundoscopic exam is normal with sharp discs and no vascular changes. Pupils are round equal and briskly reactive to light. CN III, IV, VI: extraocular movement are normal. No ptosis. CN V: Facial sensation is intact to pinprick in all 3 divisions bilaterally. Corneal responses are  intact.  CN VII: Face is symmetric with normal eye closure and smile. CN VIII: Hearing is normal to rubbing fingers, Bilateral external auditory showed erythematous scaly skin  CN IX, X: Palate elevates symmetrically. Phonation is normal. CN XI: Head turning and shoulder shrug are intact CN XII: Tongue is midline with normal movements and no atrophy.  MOTOR: Left forearm vertical well-healed scars, she has mild bilateral hand intrinsic muscle atrophy, left worse than right, also including left abductor pollicis brevis, mild left wrist flexion, finger flexion, finger abduction, grip weakness, mild right finger abduction weakness. No significant proximal arm weakness noticed.   REFLEXES: Reflexes are 2+ and symmetric at the biceps, triceps, knees, and ankles. Plantar responses are flexor.  SENSORY: Decreased light touch pinprick at bilateral first 3 fingerpads.   COORDINATION: Rapid alternating movements and fine finger movements are intact. There is no dysmetria on finger-to-nose and heel-knee-shin.    GAIT/STANCE: Posture is normal. Gait is steady with normal steps, base, arm swing, and turning. Heel and toe walking are normal. Tandem gait is normal.  Romberg is absent. Apley's maneuver, after short latency, with right ear dependent position,  she developed vertigo, downward beating rotatory nystagmus, fatigue, also had right beating nystagmus in up seated position, worse looking to the right side  DIAGNOSTIC DATA (LABS, IMAGING, TESTING) - I reviewed patient records, labs, notes, testing and imaging myself where available.   ASSESSMENT AND PLAN  Marcha SoldersLorraine K Gowans is a 61 y.o. female   Vertigo, following head trauma   this could due to benign positional vertigo, but prolonged course, need to rule out brainstem/cerebellar radiology  MRI of internal auditory canal with without contrast  Continue practice repositioning maneuver was right ear dependent position first.  Levert FeinsteinYijun Agastya Meister, M.D. Ph.D.  [facility] [address], [address] [place], [place] [address] Ph: [phone] Fax: [phone]  CC: Referring Provider

## 2016-03-26 NOTE — Addendum Note (Signed)
Addended by: Levert FeinsteinYAN, Cace Osorto on: 03/26/2016 12:34 PM   Modules accepted: Orders

## 2016-03-31 ENCOUNTER — Encounter (HOSPITAL_BASED_OUTPATIENT_CLINIC_OR_DEPARTMENT_OTHER): Payer: Self-pay | Admitting: *Deleted

## 2016-04-01 ENCOUNTER — Ambulatory Visit (INDEPENDENT_AMBULATORY_CARE_PROVIDER_SITE_OTHER): Payer: Federal, State, Local not specified - PPO

## 2016-04-01 DIAGNOSIS — H9213 Otorrhea, bilateral: Secondary | ICD-10-CM | POA: Diagnosis not present

## 2016-04-01 DIAGNOSIS — R42 Dizziness and giddiness: Secondary | ICD-10-CM | POA: Diagnosis not present

## 2016-04-01 DIAGNOSIS — M542 Cervicalgia: Secondary | ICD-10-CM | POA: Diagnosis not present

## 2016-04-01 MED ORDER — GADOPENTETATE DIMEGLUMINE 469.01 MG/ML IV SOLN: 15.0000 mL | Freq: Once | INTRAVENOUS | Status: DC | PRN

## 2016-04-06 NOTE — Anesthesia Preprocedure Evaluation (Addendum)
Anesthesia Evaluation  Patient identified by MRN, date of birth, ID band Patient awake    Reviewed: Allergy & Precautions, NPO status , Patient's Chart, lab work & pertinent test results  Airway Mallampati: II  TM Distance: >3 FB Neck ROM: Full    Dental  (+) Dental Advisory Given   Pulmonary neg pulmonary ROS, former smoker,    Pulmonary exam normal breath sounds clear to auscultation       Cardiovascular negative cardio ROS Normal cardiovascular exam Rhythm:Regular Rate:Normal     Neuro/Psych PSYCHIATRIC DISORDERS Anxiety    GI/Hepatic Neg liver ROS, GERD  ,  Endo/Other  negative endocrine ROS  Renal/GU negative Renal ROS     Musculoskeletal negative musculoskeletal ROS (+)   Abdominal   Peds  Hematology  (+) Blood dyscrasia, anemia ,   Anesthesia Other Findings   Reproductive/Obstetrics                            Anesthesia Physical Anesthesia Plan  ASA: II  Anesthesia Plan: Bier Block   Post-op Pain Management:    Induction: Intravenous  Airway Management Planned:   Additional Equipment:   Intra-op Plan:   Post-operative Plan:   Informed Consent: I have reviewed the patients History and Physical, chart, labs and discussed the procedure including the risks, benefits and alternatives for the proposed anesthesia with the patient or authorized representative who has indicated his/her understanding and acceptance.   Dental advisory given  Plan Discussed with: CRNA  Anesthesia Plan Comments:         Anesthesia Quick Evaluation

## 2016-04-07 ENCOUNTER — Ambulatory Visit (HOSPITAL_BASED_OUTPATIENT_CLINIC_OR_DEPARTMENT_OTHER)
Admission: RE | Admit: 2016-04-07 | Discharge: 2016-04-07 | Disposition: A | Discharge status: Home / Self Care | Payer: Federal, State, Local not specified - PPO | Source: Ambulatory Visit | Attending: Orthopedic Surgery | Admitting: Orthopedic Surgery

## 2016-04-07 ENCOUNTER — Ambulatory Visit (HOSPITAL_BASED_OUTPATIENT_CLINIC_OR_DEPARTMENT_OTHER): Payer: Federal, State, Local not specified - PPO | Admitting: Anesthesiology

## 2016-04-07 ENCOUNTER — Encounter (HOSPITAL_BASED_OUTPATIENT_CLINIC_OR_DEPARTMENT_OTHER): Payer: Self-pay

## 2016-04-07 ENCOUNTER — Encounter (HOSPITAL_BASED_OUTPATIENT_CLINIC_OR_DEPARTMENT_OTHER)
Admission: RE | Disposition: A | Discharge status: Home / Self Care | Payer: Self-pay | Source: Ambulatory Visit | Attending: Orthopedic Surgery

## 2016-04-07 DIAGNOSIS — G5603 Carpal tunnel syndrome, bilateral upper limbs: Secondary | ICD-10-CM | POA: Diagnosis not present

## 2016-04-07 DIAGNOSIS — G5601 Carpal tunnel syndrome, right upper limb: Secondary | ICD-10-CM | POA: Diagnosis not present

## 2016-04-07 DIAGNOSIS — Z87891 Personal history of nicotine dependence: Secondary | ICD-10-CM | POA: Diagnosis not present

## 2016-04-07 DIAGNOSIS — D649 Anemia, unspecified: Secondary | ICD-10-CM | POA: Diagnosis not present

## 2016-04-07 DIAGNOSIS — K219 Gastro-esophageal reflux disease without esophagitis: Secondary | ICD-10-CM | POA: Insufficient documentation

## 2016-04-07 DIAGNOSIS — F419 Anxiety disorder, unspecified: Secondary | ICD-10-CM | POA: Diagnosis not present

## 2016-04-07 HISTORY — PX: CARPAL TUNNEL RELEASE: SHX101

## 2016-04-07 HISTORY — DX: Anxiety disorder, unspecified: F41.9

## 2016-04-07 SURGERY — CARPAL TUNNEL RELEASE
Anesthesia: Regional | Site: Wrist | Laterality: Right

## 2016-04-07 MED ORDER — TRAMADOL HCL 50 MG PO TABS: 50.0000 mg | ORAL_TABLET | Freq: Four times a day (QID) | ORAL | 0 refills | Status: DC | PRN

## 2016-04-07 MED ORDER — LIDOCAINE 2% (20 MG/ML) 5 ML SYRINGE
INTRAMUSCULAR | Status: AC
  Filled 2016-04-07: qty 5

## 2016-04-07 MED ORDER — CEFAZOLIN SODIUM-DEXTROSE 2-4 GM/100ML-% IV SOLN
2.0000 g | INTRAVENOUS | Status: AC
  Administered 2016-04-07: 2 g via INTRAVENOUS

## 2016-04-07 MED ORDER — CHLORHEXIDINE GLUCONATE 4 % EX LIQD: 60.0000 mL | Freq: Once | CUTANEOUS | Status: DC

## 2016-04-07 MED ORDER — CEFAZOLIN SODIUM-DEXTROSE 2-4 GM/100ML-% IV SOLN
INTRAVENOUS | Status: AC
  Filled 2016-04-07: qty 100

## 2016-04-07 MED ORDER — MEPERIDINE HCL 25 MG/ML IJ SOLN: 6.2500 mg | INTRAMUSCULAR | Status: DC | PRN

## 2016-04-07 MED ORDER — LIDOCAINE HCL (PF) 0.5 % IJ SOLN
INTRAMUSCULAR | Status: DC | PRN
  Administered 2016-04-07: 30 mL via INTRAVENOUS

## 2016-04-07 MED ORDER — FENTANYL CITRATE (PF) 100 MCG/2ML IJ SOLN
INTRAMUSCULAR | Status: AC
  Filled 2016-04-07: qty 2

## 2016-04-07 MED ORDER — FENTANYL CITRATE (PF) 100 MCG/2ML IJ SOLN: 25.0000 ug | INTRAMUSCULAR | Status: DC | PRN

## 2016-04-07 MED ORDER — BUPIVACAINE HCL (PF) 0.25 % IJ SOLN
INTRAMUSCULAR | Status: DC | PRN
Start: 2016-04-07 — End: 2016-04-07
  Administered 2016-04-07: 6 mL

## 2016-04-07 MED ORDER — PROMETHAZINE HCL 25 MG/ML IJ SOLN: 6.2500 mg | INTRAMUSCULAR | Status: DC | PRN

## 2016-04-07 MED ORDER — OXYCODONE HCL 5 MG PO TABS: 5.0000 mg | ORAL_TABLET | Freq: Once | ORAL | Status: DC | PRN

## 2016-04-07 MED ORDER — ONDANSETRON HCL 4 MG/2ML IJ SOLN
INTRAMUSCULAR | Status: AC
  Filled 2016-04-07: qty 2

## 2016-04-07 MED ORDER — BUPIVACAINE HCL (PF) 0.25 % IJ SOLN
INTRAMUSCULAR | Status: AC
  Filled 2016-04-07: qty 30

## 2016-04-07 MED ORDER — ONDANSETRON HCL 4 MG/2ML IJ SOLN
INTRAMUSCULAR | Status: DC | PRN
  Administered 2016-04-07: 4 mg via INTRAVENOUS

## 2016-04-07 MED ORDER — LACTATED RINGERS IV SOLN
INTRAVENOUS | Status: DC
  Administered 2016-04-07: 08:00:00 via INTRAVENOUS

## 2016-04-07 MED ORDER — MIDAZOLAM HCL 2 MG/2ML IJ SOLN
1.0000 mg | INTRAMUSCULAR | Status: DC | PRN
  Administered 2016-04-07: 1 mg via INTRAVENOUS

## 2016-04-07 MED ORDER — SCOPOLAMINE 1 MG/3DAYS TD PT72
1.0000 | MEDICATED_PATCH | Freq: Once | TRANSDERMAL | Status: DC | PRN
Start: 2016-04-07 — End: 2016-04-07

## 2016-04-07 MED ORDER — FENTANYL CITRATE (PF) 100 MCG/2ML IJ SOLN
50.0000 ug | INTRAMUSCULAR | Status: DC | PRN
  Administered 2016-04-07: 50 ug via INTRAVENOUS

## 2016-04-07 MED ORDER — MIDAZOLAM HCL 2 MG/2ML IJ SOLN
INTRAMUSCULAR | Status: AC
  Filled 2016-04-07: qty 2

## 2016-04-07 MED ORDER — PROPOFOL 500 MG/50ML IV EMUL
INTRAVENOUS | Status: DC | PRN
  Administered 2016-04-07: 75 ug/kg/min via INTRAVENOUS

## 2016-04-07 MED ORDER — OXYCODONE HCL 5 MG/5ML PO SOLN: 5.0000 mg | Freq: Once | ORAL | Status: DC | PRN

## 2016-04-07 SURGICAL SUPPLY — 35 items
BLADE SURG 15 STRL LF DISP TIS (BLADE) ×1 IMPLANT
BLADE SURG 15 STRL SS (BLADE) ×1
BNDG COHESIVE 3X5 TAN STRL LF (GAUZE/BANDAGES/DRESSINGS) ×2 IMPLANT
BNDG ESMARK 4X9 LF (GAUZE/BANDAGES/DRESSINGS) IMPLANT
BNDG GAUZE ELAST 4 BULKY (GAUZE/BANDAGES/DRESSINGS) ×2 IMPLANT
CHLORAPREP W/TINT 26ML (MISCELLANEOUS) ×2 IMPLANT
CORDS BIPOLAR (ELECTRODE) ×2 IMPLANT
COVER BACK TABLE 60X90IN (DRAPES) ×2 IMPLANT
COVER MAYO STAND STRL (DRAPES) ×2 IMPLANT
CUFF TOURNIQUET SINGLE 18IN (TOURNIQUET CUFF) ×2 IMPLANT
DRAPE EXTREMITY T 121X128X90 (DRAPE) ×2 IMPLANT
DRAPE SURG 17X23 STRL (DRAPES) ×2 IMPLANT
DRSG PAD ABDOMINAL 8X10 ST (GAUZE/BANDAGES/DRESSINGS) ×2 IMPLANT
GAUZE SPONGE 4X4 12PLY STRL (GAUZE/BANDAGES/DRESSINGS) ×2 IMPLANT
GAUZE XEROFORM 1X8 LF (GAUZE/BANDAGES/DRESSINGS) ×2 IMPLANT
GLOVE BIOGEL PI IND STRL 7.0 (GLOVE) ×1 IMPLANT
GLOVE BIOGEL PI IND STRL 8.5 (GLOVE) ×1 IMPLANT
GLOVE BIOGEL PI INDICATOR 7.0 (GLOVE) ×1
GLOVE BIOGEL PI INDICATOR 8.5 (GLOVE) ×1
GLOVE SURG ORTHO 8.0 STRL STRW (GLOVE) ×2 IMPLANT
GLOVE SURG SYN 7.5  E (GLOVE) ×1
GLOVE SURG SYN 7.5 E (GLOVE) ×1 IMPLANT
GOWN STRL REUS W/ TWL LRG LVL3 (GOWN DISPOSABLE) ×1 IMPLANT
GOWN STRL REUS W/TWL LRG LVL3 (GOWN DISPOSABLE) ×1
GOWN STRL REUS W/TWL XL LVL3 (GOWN DISPOSABLE) ×2 IMPLANT
NEEDLE PRECISIONGLIDE 27X1.5 (NEEDLE) ×2 IMPLANT
NS IRRIG 1000ML POUR BTL (IV SOLUTION) ×2 IMPLANT
PACK BASIN DAY SURGERY FS (CUSTOM PROCEDURE TRAY) ×2 IMPLANT
STOCKINETTE 4X48 STRL (DRAPES) ×2 IMPLANT
SUT ETHILON 4 0 PS 2 18 (SUTURE) ×2 IMPLANT
SUT VICRYL 4-0 PS2 18IN ABS (SUTURE) IMPLANT
SYR BULB 3OZ (MISCELLANEOUS) ×2 IMPLANT
SYR CONTROL 10ML LL (SYRINGE) IMPLANT
TOWEL OR 17X24 6PK STRL BLUE (TOWEL DISPOSABLE) ×2 IMPLANT
UNDERPAD 30X30 (UNDERPADS AND DIAPERS) ×2 IMPLANT

## 2016-04-07 NOTE — Discharge Instructions (Addendum)

## 2016-04-07 NOTE — Transfer of Care (Signed)
Immediate Anesthesia Transfer of Care Note  Patient: [name]  Procedure(s) Performed: Procedure(s) with comments: RIGHT CARPAL TUNNEL RELEASE (Right) - REG/FAB  Patient Location: PACU  Anesthesia Type:Bier block  Level of Consciousness: awake, alert , oriented and patient cooperative  Airway & Oxygen Therapy: Patient Spontanous Breathing and Patient connected to face mask oxygen  Post-op Assessment: Report given to RN and Post -op Vital signs reviewed and stable  Post vital signs: Reviewed and stable  Last Vitals:  Vitals:   04/07/16 0752  BP: 132/81  Pulse: 75  Resp: 18  Temp: 36.4 C    Last Pain:  Vitals:   04/07/16 0752  TempSrc: Oral         Complications: No apparent anesthesia complications

## 2016-04-07 NOTE — H&P (Signed)
[name] is an 61 y.o. female.   Chief Complaint:numbness right hand ZOX:WRUEAVWU is a 61 year old right-hand-dominant female complaining of pain in her right arm with numbness and tingling this been going for at least 10 years. She states began after rotator cuff surgery. She complains of thumb to ring fingers. It only occasionally awakens her at night. She has a VAS score of a stiffness and pain of 8/10. She has had an interosseous nerve and carpal tunnel release done by Dr.Sypher 6 years ago on her left side. States nothing seems to make this better or worse other than positioning. She states that she has discomfort in the upper arm the biceps area. She is complaining of a feeling of weakness. She takes diclofenac for her back. She has no history of injury to her neck but does see a chiropractor following a fall a year or so ago complaining of her left side. She has a history of lupus. Her old chart is reviewed. She has had nerve conductions done by Dr. Jillyn Hidden which showed bilateral carpal tunnel several years ago.She has had  Dr. Riccardo Dubin repeat these. This reveals a carpal tunnel syndrome on her right side with a motor delay of 7.34 sensory delay of 5.26. She also has motor changes          Past Medical History:  Diagnosis Date  . Anemia    reportedly IDA; GIVENS performed by Dr. Elnoria Howard Oct 2009, no abnormalities  . Anxiety   . Carpal tunnel syndrome   . Dizziness   . GERD (gastroesophageal reflux disease)   . S/P colonoscopy Sept 2009   normal colon, large hemorrhoids  . S/P endoscopy Sept 2009   Dr. Elnoria Howard: hiatal hernia  . Vertigo     Past Surgical History:  Procedure Laterality Date  . ADENOIDECTOMY    . bilateral foot surgery    . CHOLECYSTECTOMY    . endoscopy and colonoscopy  2009   Dr. Elnoria Howard: hiatal hernia, normal colon, large hemorrhoids  . GIVENS CAPSULE STUDY  Oct 2009   Dr. Elnoria Howard: normal  . KNEE SURGERY Right   . NISSEN FUNDOPLICATION    . NOSE SURGERY     . ROTATOR CUFF REPAIR    . TONSILLECTOMY      Family History  Problem Relation Age of Onset  . Breast cancer Sister   . Diabetes Father   . Cervical cancer Maternal Grandmother   . Stroke Maternal Grandfather   . Heart attack Paternal Grandfather   . Colon cancer Neg Hx    Social History:  reports that she has quit smoking. She has never used smokeless tobacco. She reports that she drinks alcohol. She reports that she does not use drugs.  Allergies:  Allergies  Allergen Reactions  . Bismuth Subsalicylate Nausea And Vomiting  . Codeine Nausea And Vomiting    No prescriptions prior to admission.    No results found for this or any previous visit (from the past 48 hour(s)).  No results found.   Pertinent items are noted in HPI.  Height 5\' 4"  (1.626 m), weight 72.6 kg (160 lb).  General appearance: alert, cooperative and appears stated age Head: Normocephalic, without obvious abnormality Neck: no JVD Resp: clear to auscultation bilaterally Cardio: regular rate and rhythm, S1, S2 normal, no murmur, click, rub or gallop GI: soft, non-tender; bowel sounds normal; no masses,  no organomegaly Extremities: numbness right hand Pulses: 2+ and symmetric Skin: Skin color, texture, turgor normal. No rashes or lesions  Neurologic: Grossly normal Incision/Wound: na  Assessment/Plan Assessment:  1. Carpal tunnel syndrome of right wrist    Plan: Her nerve conductions are reviewed with her. She is going to see a neurologist and knows that there is some motor problems in her upper right arm. Pre-peri-and postoperative course for carpal tunnel release are discussed with her. She is advised that there is no guarantee to the surgery the possibility of infection recurrence injury to arteries nerves tendons incomplete release symptoms and dystrophy. She would like to proceed with right carpal tunnel release and outpatient under regional anesthesia.      [name] R 04/07/2016, 3:51  AM

## 2016-04-07 NOTE — Op Note (Signed)
[name], [name]           ACCOUNT NO.:  [id number]  MEDICAL RECORD NO.:  [id number]  LOCATION:                                 FACILITY:  PHYSICIAN:  Cindee SaltGary Khalon Cansler, M.D.            DATE OF BIRTH:  DATE OF PROCEDURE:  04/07/2016 DATE OF DISCHARGE:                              OPERATIVE REPORT   PREOPERATIVE DIAGNOSIS:  Carpal tunnel syndrome, right hand.  POSTOPERATIVE DIAGNOSIS:  Carpal tunnel syndrome, right hand.  OPERATION:  Decompression of right median nerve.  SURGEON:  Cindee SaltGary Nikoli Nasser, MD.  ANESTHESIA:  Forearm-based IV regional with local infiltration sedation.  [facility]  INDICATIONS:  In preoperative area, the patient was seen.  The extremity was marked by both the patient and surgeon.  Antibiotic was given. Questions were encouraged and answered to her satisfaction.  She is aware there is no guarantee to the surgery; the possibility of infection; recurrence of injury to arteries, nerves, tendons; incomplete relief of symptoms and dystrophy.  PROCEDURE IN DETAIL:  The patient was brought to the operating room, where a forearm-based IV regional anesthetic was carried out without difficulty under the direction of the Anesthesia Department.  She was prepped using ChloraPrep in a supine position with the right arm free. A 3-minute dry time was allowed and the time-out taken, confirming the patient and procedure.  After adequate anesthesia was afforded, a longitudinal incision was made in the right palm.  This was carried down through subcutaneous tissue.  Bleeders were electrocauterized with bipolar.  The palmar fascia was split.  The superficial palmar arch was identified.  Retractors were placed retracting the flexor tendons and median nerve radially and the ulnar nerve ulnarly.  The flexor retinaculum was then released on its ulnar aspect.  This was done with sharp dissection.  Right angle and Sewell retractor were placed  between the skin and forearm fascia.  The underlying nerve and tendons were then dissected free with blunt dissection.  The proximal aspect of the flexor retinaculum and distal portion of the forearm fascia was then released for approximately 2 cm proximal to the wrist crease under direct vision. The canal was explored.  An area of compression to the nerve was apparent.  The motor branch entered into muscle distally.  The wound was copiously irrigated with saline.  The skin was closed with interrupted 4- 0 nylon sutures.  A local infiltration with 0.25% bupivacaine without epinephrine was given.  Approximately 8 mL was used.  A sterile compressive dressing with the fingers and thumb free was applied.  On deflation of the tourniquet, all fingers immediately pinked.  She was taken to the recovery room for observation in satisfactory condition.  She will be discharged to home [facility] [place] [facility], on [facility]20.          ______________________________ Cindee SaltGary Mccoy Testa, M.D.     GK/MEDQ  D:  04/07/2016  T:  04/07/2016  Job:  [id number]

## 2016-04-07 NOTE — Anesthesia Procedure Notes (Signed)
Anesthesia Regional Block: Bier block (IV Regional)   Pre-Anesthetic Checklist: ,, timeout performed, Correct Patient, Correct Site, Correct Laterality, Correct Procedure,, site marked, surgical consent,, at surgeon's request  Laterality: Right     Needles:  Injection technique: Single-shot  Needle Type: Other      Needle Gauge: 22     Additional Needles:   Procedures:,,,,,,, Esmarch exsanguination, single tourniquet utilized,  Narrative:   Performed by: Personally       

## 2016-04-07 NOTE — Op Note (Signed)
Dictation Number [id number]

## 2016-04-07 NOTE — Brief Op Note (Signed)
04/07/2016  9:06 AM  PATIENT:  Laural RoesLorraine K Sebastiano  61 y.o. female  PRE-OPERATIVE DIAGNOSIS:  CARPAL TUNNEL SYNDROME RIGHT  POST-OPERATIVE DIAGNOSIS:  CARPAL TUNNEL SYNDROME RIGHT  PROCEDURE:  Procedure(s) with comments: RIGHT CARPAL TUNNEL RELEASE (Right) - REG/FAB  SURGEON:  Surgeon(s) and Role:    * Cindee SaltGary Cassundra Mckeever, MD - Primary  PHYSICIAN ASSISTANT:   ASSISTANTS: none   ANESTHESIA:   local, regional and IV sedation  EBL:  Total I/O In: 600 [I.V.:600] Out: -   BLOOD ADMINISTERED:none  DRAINS: none   LOCAL MEDICATIONS USED:  MARCAINE     SPECIMEN:  none  DISPOSITION OF SPECIMEN:  N/A  COUNTS:  YES  TOURNIQUET:   Total Tourniquet Time Documented: Forearm (Right) - 20 minutes Total: Forearm (Right) - 20 minutes   DICTATION: .Other Dictation: Dictation Number [id number]  PLAN OF CARE: Discharge to home after PACU  PATIENT DISPOSITION:  PACU - hemodynamically stable.

## 2016-04-07 NOTE — Anesthesia Procedure Notes (Signed)
Procedure Name: MAC Date/Time: 04/07/2016 8:52 AM Performed by: Rex Oesterle D Pre-anesthesia Checklist: Patient identified, Emergency Drugs available, Suction available, Patient being monitored and Timeout performed Patient Re-evaluated:Patient Re-evaluated prior to inductionOxygen Delivery Method: Simple face mask

## 2016-04-07 NOTE — Anesthesia Postprocedure Evaluation (Signed)
Anesthesia Post Note  Patient: [name]  Procedure(s) Performed: Procedure(s) (LRB): RIGHT CARPAL TUNNEL RELEASE (Right)  Patient location during evaluation: PACU Anesthesia Type: Bier Block Level of consciousness: awake and alert Pain management: pain level controlled Vital Signs Assessment: post-procedure vital signs reviewed and stable Respiratory status: spontaneous breathing Cardiovascular status: stable Anesthetic complications: no       Last Vitals:  Vitals:   04/07/16 0945 04/07/16 1012  BP: (!) 144/92 140/89  Pulse: 70 62  Resp: 14 18  Temp:  36.7 C    Last Pain:  Vitals:   04/07/16 1012  TempSrc: Oral  PainSc: 0-No pain                 Lewie LoronJohn Nealy Hickmon

## 2016-04-08 ENCOUNTER — Encounter (HOSPITAL_BASED_OUTPATIENT_CLINIC_OR_DEPARTMENT_OTHER): Payer: Self-pay | Admitting: Orthopedic Surgery

## 2016-05-04 DIAGNOSIS — E663 Overweight: Secondary | ICD-10-CM | POA: Diagnosis not present

## 2016-05-04 DIAGNOSIS — Z6826 Body mass index (BMI) 26.0-26.9, adult: Secondary | ICD-10-CM | POA: Diagnosis not present

## 2016-05-04 DIAGNOSIS — F419 Anxiety disorder, unspecified: Secondary | ICD-10-CM | POA: Diagnosis not present

## 2016-05-04 DIAGNOSIS — Z1389 Encounter for screening for other disorder: Secondary | ICD-10-CM | POA: Diagnosis not present

## 2016-05-05 ENCOUNTER — Ambulatory Visit: Payer: Federal, State, Local not specified - PPO | Admitting: Neurology

## 2016-05-21 ENCOUNTER — Encounter: Payer: Self-pay | Admitting: Neurology

## 2016-05-21 ENCOUNTER — Ambulatory Visit (INDEPENDENT_AMBULATORY_CARE_PROVIDER_SITE_OTHER): Payer: Federal, State, Local not specified - PPO | Admitting: Neurology

## 2016-05-21 VITALS — BP 146/93 | HR 75 | Ht 64.0 in | Wt 162.0 lb

## 2016-05-21 DIAGNOSIS — M542 Cervicalgia: Secondary | ICD-10-CM | POA: Diagnosis not present

## 2016-05-21 DIAGNOSIS — R42 Dizziness and giddiness: Secondary | ICD-10-CM | POA: Diagnosis not present

## 2016-05-21 NOTE — Progress Notes (Signed)
PATIENT: [name] DOB: [birth year]  Chief Complaint  Patient presents with  . Dizziness    She would like to review her MRI results.  Her dizzy spells have improved.  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  She has noticed increased ringing and pain in her right ear.      HISTORICAL  [name] is a 61 years old right-handed female, seen in refer by chiropractor Reatha Harps for evaluation of dizziness, her primary care physician is Dr. Elfredia Nevins, initial evaluation was on February 18 2016.  I reviewed and summarized the referring note, she had a history of anemia, she had dizziness since she fell in November 2015, when she pick up her mail, she fell on her pavement at the right vortex region, with skin abrasion, black and blue eyes. But no loss of consciousness.  Since the event, she had transient vertigo, triggered by sudden positional change, she had really positional maneuver, which showed significant improvement,  Since summer of 2017, she had recurrent dizziness again, if she lying down especially to her right side, she has transient eye jumping, spinning sensation, improved after holding still, also with sudden positional change she has transient vertigo, she also complains of chronic neck pain, taking diclofenac as needed,  She denies significant current hearing loss, in December 2017, because of her recurrent neck pain, vertigo, she was evaluated and had neck manipulation by chiropractor, which did help her neck pain, but she denies significant improvement of her vertigo,  She reported a history of left arm weakness left hand weakness due to previous left arm repetitive movement, had left forearm decompression surgery by hand surgery in the past, she also reported a history of right carpal tunnel syndromes.  UPDATE Mar 23 2016: We have personally reviewed MRI cervical in January 2018, multilevel degenerative disc diseasesignificant  foraminal canal stenosis,  On further questioning, she reported dizziness since January 2016, this was 2 months after she fell landed on her right parietal region, she had bilateral ear drainage, was seen by ENT, had ear drop, treated with antibiotics,  After she recovered from ear infection, she began to have vertigo, this can be triggered lying on the right side backwards. She denies tinnitus, no hearing loss   UPDATE May 21 2016: We have personally reviewed MRi brian with internal acoustic canal with without contrast in Feb 2018, that was nromal. She was able to practice recovery positional maneuver, which has helped her dizziness, but she still felt intermittent right mastoid pain, tinnitus, for a while, she had transient hearing loss with her vertigo,  REVIEW OF SYSTEMS: Full 14 system review of systems performed and notable only for light sensitivity, ear pain, ringing ears, palpitation, insomnia, environmental allergy, joint pain, back pain, achy muscles, muscle cramps, neck pain, headaches, anxiety  ALLERGIES: Allergies  Allergen Reactions  . Bismuth Subsalicylate Nausea And Vomiting  . Codeine Nausea And Vomiting    HOME MEDICATIONS: Current Outpatient Prescriptions  Medication Sig Dispense Refill  . aspirin-acetaminophen-caffeine (EXCEDRIN MIGRAINE) 250-250-65 MG tablet Take 1 tablet by mouth every 6 (six) hours as needed for headache.    . diazepam (VALIUM) 2 MG tablet Take 2 mg by mouth as needed.    . diclofenac (VOLTAREN) 75 MG EC tablet Take 75 mg by mouth 2 (two) times daily.     Marland Kitchen dicyclomine (BENTYL) 10 MG capsule Take 1 capsule (10 mg total) by mouth 4 (four) times daily -  before meals and at bedtime. 120 capsule 3  . DULoxetine (CYMBALTA) 60 MG capsule Take 60 mg by mouth daily.    . RABEprazole (ACIPHEX) 20 MG tablet Take 1 tablet (20 mg total) by mouth 2 (two) times daily before a meal. 60 tablet 3  . ranitidine (ZANTAC) 300 MG tablet Take 300 mg by mouth at  bedtime.     No current facility-administered medications for this visit.    Facility-Administered Medications Ordered in Other Visits  Medication Dose Route Frequency Provider Last Rate Last Dose  . gadopentetate dimeglumine (MAGNEVIST) injection 15 mL  15 mL Intravenous Once PRN Levert Feinstein, MD        PAST MEDICAL HISTORY: Past Medical History:  Diagnosis Date  . Anemia    reportedly IDA; GIVENS performed by Dr. Elnoria Howard Oct 2009, no abnormalities  . Anxiety   . Carpal tunnel syndrome   . Dizziness   . GERD (gastroesophageal reflux disease)   . S/P colonoscopy Sept 2009   normal colon, large hemorrhoids  . S/P endoscopy Sept 2009   Dr. Elnoria Howard: hiatal hernia  . Vertigo     PAST SURGICAL HISTORY: Past Surgical History:  Procedure Laterality Date  . ADENOIDECTOMY    . bilateral foot surgery    . CARPAL TUNNEL RELEASE Right 04/07/2016   Procedure: RIGHT CARPAL TUNNEL RELEASE;  Surgeon: Cindee Salt, MD;  Location: [facility];  Service: Orthopedics;  Laterality: Right;  REG/FAB  . CHOLECYSTECTOMY    . endoscopy and colonoscopy  2009   Dr. Elnoria Howard: hiatal hernia, normal colon, large hemorrhoids  . GIVENS CAPSULE STUDY  Oct 2009   Dr. Elnoria Howard: normal  . KNEE SURGERY Right   . NISSEN FUNDOPLICATION    . NOSE SURGERY    . ROTATOR CUFF REPAIR    . TONSILLECTOMY      FAMILY HISTORY: Family History  Problem Relation Age of Onset  . Breast cancer Sister   . Diabetes Father   . Cervical cancer Maternal Grandmother   . Stroke Maternal Grandfather   . Heart attack Paternal Grandfather   . Colon cancer Neg Hx     SOCIAL HISTORY:  Social History   Social History  . Marital status: Widowed    Spouse name: N/A  . Number of children: 0  . Years of education: 14   Occupational History  . Retired [employer]   Social History Main Topics  . Smoking status: Former Games developer  . Smokeless tobacco: Never Used     Comment: Quit 2000  . Alcohol use 0.0 oz/week     Comment:  occ  . Drug use: No  . Sexual activity: Not on file   Other Topics Concern  . Not on file   Social History Narrative   Lives at home with husband.   Right-handed.   No caffeine use.     PHYSICAL EXAM   Vitals:   05/21/16 1055  BP: (!) 146/93  Pulse: 75  Weight: 162 lb (73.5 kg)  Height:  (1.626 m)    Not recorded      Body mass index is 27.81 kg/m.  PHYSICAL EXAMNIATION:  Gen: NAD, conversant, well nourised, obese, well groomed                     Cardiovascular: Regular rate rhythm, no peripheral edema, warm, nontender. Eyes: Conjunctivae clear without exudates or hemorrhage Neck: Supple, no carotid bruits. Pulmonary: Clear to auscultation bilaterally   NEUROLOGICAL  EXAM:  MENTAL STATUS: Speech:    Speech is normal; fluent and spontaneous with normal comprehension.  Cognition:     Orientation to time, place and person     Normal recent and remote memory     Normal Attention span and concentration     Normal Language, naming, repeating,spontaneous speech     Fund of knowledge   CRANIAL NERVES: CN II: Visual fields are full to confrontation. Fundoscopic exam is normal with sharp discs and no vascular changes. Pupils are round equal and briskly reactive to light. CN III, IV, VI: extraocular movement are normal. No ptosis. CN V: Facial sensation is intact to pinprick in all 3 divisions bilaterally. Corneal responses are intact.  CN VII: Face is symmetric with normal eye closure and smile. CN VIII: Hearing is normal to rubbing fingers, Bilateral external auditory showed erythematous scaly skin  CN IX, X: Palate elevates symmetrically. Phonation is normal. CN XI: Head turning and shoulder shrug are intact CN XII: Tongue is midline with normal movements and no atrophy.  MOTOR: Left forearm vertical well-healed scars, she has mild bilateral hand intrinsic muscle atrophy, left worse than right, also including left abductor pollicis brevis, mild left wrist  flexion, finger flexion, finger abduction, grip weakness, mild right finger abduction weakness. No significant proximal arm weakness noticed.   REFLEXES: Reflexes are 2+ and symmetric at the biceps, triceps, knees, and ankles. Plantar responses are flexor.  SENSORY: Decreased light touch pinprick at bilateral first 3 fingerpads.   COORDINATION: Rapid alternating movements and fine finger movements are intact. There is no dysmetria on finger-to-nose and heel-knee-shin.    GAIT/STANCE: Posture is normal. Gait is steady with normal steps, base, arm swing, and turning. Heel and toe walking are normal. Tandem gait is normal.  Romberg is absent.   DIAGNOSTIC DATA (LABS, IMAGING, TESTING) - I reviewed patient records, labs, notes, testing and imaging myself where available.   ASSESSMENT AND PLAN  SHABANA ARMENTROUT is a 61 y.o. female   Intermittent Vertigo, following head trauma   She also complains of right ear pain, mastoid area tenderness upon deep palpitation, intermittent tinnitus, hearing loss,  Differentiation diagnosis includes benign positional vertigo, her dizziness does improve with re-positional maneuver, right ear dependent position first, possibility also include Mnire's disease,  She may continue follow-up with her ENT physician, only returned for new issues    Levert Feinstein, M.D. Ph.D.  [facility] [address], [address] [place], [place] [address] Ph: [phone] Fax: [phone]  CC: Referring Provider

## 2016-05-27 ENCOUNTER — Ambulatory Visit (INDEPENDENT_AMBULATORY_CARE_PROVIDER_SITE_OTHER): Payer: Federal, State, Local not specified - PPO | Admitting: Gastroenterology

## 2016-05-27 ENCOUNTER — Encounter: Payer: Self-pay | Admitting: Gastroenterology

## 2016-05-27 VITALS — BP 122/81 | HR 78 | Temp 98.7°F | Ht 64.0 in | Wt 162.0 lb

## 2016-05-27 DIAGNOSIS — K219 Gastro-esophageal reflux disease without esophagitis: Secondary | ICD-10-CM

## 2016-05-27 NOTE — Patient Instructions (Signed)
Continue taking Prilosec once daily.  We will see you back in 6 months!

## 2016-05-27 NOTE — Progress Notes (Signed)
Referring Provider: Fusco, Lawrence, MD Primary Care Physician:  [name] GI: [name]   Chief Complaint  Patient presents with  . Gastroesophageal Reflux    f/u, doing ok    HPI:   [name] is a 61 y.o. female presenting today with a history of GERD, IDA in remote past, due for colonoscopy in 2019. Tried/failed Zantac, Nexium, Prilosec, Tagamet. Dexilant not covered by insurance. Aciphex doing well with her at last visit but had been taking excedrin and diclofenac routinely. PPI was increased to BID with plans for EGD if no improvement with dyspepsia.   Started cutting medications out and noted that her nerves were what was exacerbating symptoms. PCP put her on some Valium, which she takes when her heart starts palpitating. No longer on Aciphex and actually taking generic Prilosec, which is doing well for her. Will rarely take Tums or Rolaids. No dysphagia. She states her stepson is going through difficult times, and this was causing a lot of stress.   She had postprandial fecal urgency at times during last visit. She took the Bentyl for awhile and did better, but now she doesn't feel like she needs it any longer. Actually started having more constipation.  Next colonoscopy due in 2019. No rectal bleeding.   Past Medical History:  Diagnosis Date  . Anemia    reportedly IDA; GIVENS performed by [name] Oct 2009, no abnormalities  . Anxiety   . Carpal tunnel syndrome   . Dizziness   . GERD (gastroesophageal reflux disease)   . S/P colonoscopy Sept 2009   normal colon, large hemorrhoids  . S/P endoscopy Sept 2009   [name]: hiatal hernia  . Vertigo     Past Surgical History:  Procedure Laterality Date  . ADENOIDECTOMY    . bilateral foot surgery    . CARPAL TUNNEL RELEASE Right 04/07/2016   Procedure: RIGHT CARPAL TUNNEL RELEASE;  Surgeon: Amanda Salt, MD;  Location: [facility];  Service: Orthopedics;  Laterality: Right;  REG/FAB    . CHOLECYSTECTOMY    . endoscopy and colonoscopy  2009   [name]: hiatal hernia, normal colon, large hemorrhoids  . GIVENS CAPSULE STUDY  Oct 2009   [name]: normal  . KNEE SURGERY Right   . NISSEN FUNDOPLICATION    . NOSE SURGERY    . ROTATOR CUFF REPAIR    . TONSILLECTOMY      Current Outpatient Prescriptions  Medication Sig Dispense Refill  . aspirin-acetaminophen-caffeine (EXCEDRIN MIGRAINE) 250-250-65 MG tablet Take 1 tablet by mouth every 6 (six) hours as needed for headache.    . diazepam (VALIUM) 2 MG tablet Take 2 mg by mouth as needed.    . diclofenac (VOLTAREN) 75 MG EC tablet Take 75 mg by mouth as needed.     Marland Kitchen omeprazole (PRILOSEC) 20 MG capsule Take 20 mg by mouth daily.     No current facility-administered medications for this visit.    Facility-Administered Medications Ordered in Other Visits  Medication Dose Route Frequency Provider Last Rate Last Dose  . gadopentetate dimeglumine (MAGNEVIST) injection 15 mL  15 mL Intravenous Once PRN Amanda Feinstein, MD        Allergies as of 05/27/2016 - Review Complete 05/27/2016  Allergen Reaction Noted  . Bismuth subsalicylate Nausea And Vomiting 01/21/2011  . Codeine Nausea And Vomiting 01/21/2011    Family History  Problem Relation Age of Onset  . Breast cancer Sister   .  Diabetes Father   . Cervical cancer Maternal Grandmother   . Stroke Maternal Grandfather   . Heart attack Paternal Grandfather   . Colon cancer Neg Hx     Social History   Social History  . Marital status: Widowed    Spouse name: N/A  . Number of children: 0  . Years of education: 14   Occupational History  . Retired [employer]   Social History Main Topics  . Smoking status: Former Games developer  . Smokeless tobacco: Never Used     Comment: Quit 2000  . Alcohol use 0.0 oz/week     Comment: occ  . Drug use: No  . Sexual activity: Not Asked   Other Topics Concern  . None   Social History Narrative   Lives at home with husband.    Right-handed.   No caffeine use.    Review of Systems: As mentioned in HPI   Physical Exam: BP 122/81   Pulse 78   Temp 98.7 F (37.1 C) (Oral)   Ht  (1.626 m)   Wt 162 lb (73.5 kg)   BMI 27.81 kg/m  General:   Alert and oriented. No distress noted. Pleasant and cooperative.  Head:  Normocephalic and atraumatic. Eyes:  Conjuctiva clear without scleral icterus. Mouth:  Oral mucosa pink and moist. Good dentition. No lesions. Abdomen:  +BS, soft, non-tender and non-distended. No rebound or guarding. No HSM or masses noted. Msk:  Symmetrical without gross deformities. Normal posture. Extremities:  Without edema. Neurologic:  Alert and  oriented x4 Psych:  Alert and cooperative. Normal mood and affect.

## 2016-05-27 NOTE — Assessment & Plan Note (Signed)
61 year old female doing well with omeprazole once daily. No longer having abdominal pain or any bothersome symptoms. No dysphagia. Will have her return in 6 months. As of note, next colonoscopy due in 2019.

## 2016-05-28 NOTE — Progress Notes (Signed)
cc'ed to pcp °

## 2016-06-11 DIAGNOSIS — R252 Cramp and spasm: Secondary | ICD-10-CM | POA: Diagnosis not present

## 2016-06-11 DIAGNOSIS — Z1389 Encounter for screening for other disorder: Secondary | ICD-10-CM | POA: Diagnosis not present

## 2016-06-11 DIAGNOSIS — Z6827 Body mass index (BMI) 27.0-27.9, adult: Secondary | ICD-10-CM | POA: Diagnosis not present

## 2016-06-11 DIAGNOSIS — E663 Overweight: Secondary | ICD-10-CM | POA: Diagnosis not present

## 2016-06-11 DIAGNOSIS — M1990 Unspecified osteoarthritis, unspecified site: Secondary | ICD-10-CM | POA: Diagnosis not present

## 2016-06-12 DIAGNOSIS — M5136 Other intervertebral disc degeneration, lumbar region: Secondary | ICD-10-CM | POA: Diagnosis not present

## 2016-06-12 DIAGNOSIS — M4726 Other spondylosis with radiculopathy, lumbar region: Secondary | ICD-10-CM | POA: Diagnosis not present

## 2016-06-12 DIAGNOSIS — M545 Low back pain: Secondary | ICD-10-CM | POA: Diagnosis not present

## 2016-06-12 DIAGNOSIS — M4155 Other secondary scoliosis, thoracolumbar region: Secondary | ICD-10-CM | POA: Diagnosis not present

## 2016-08-27 DIAGNOSIS — J343 Hypertrophy of nasal turbinates: Secondary | ICD-10-CM | POA: Diagnosis not present

## 2016-08-27 DIAGNOSIS — Z1389 Encounter for screening for other disorder: Secondary | ICD-10-CM | POA: Diagnosis not present

## 2016-08-27 DIAGNOSIS — R070 Pain in throat: Secondary | ICD-10-CM | POA: Diagnosis not present

## 2016-08-27 DIAGNOSIS — J302 Other seasonal allergic rhinitis: Secondary | ICD-10-CM | POA: Diagnosis not present

## 2016-08-27 DIAGNOSIS — Z6826 Body mass index (BMI) 26.0-26.9, adult: Secondary | ICD-10-CM | POA: Diagnosis not present

## 2016-08-27 DIAGNOSIS — J069 Acute upper respiratory infection, unspecified: Secondary | ICD-10-CM | POA: Diagnosis not present

## 2016-10-28 DIAGNOSIS — Z1389 Encounter for screening for other disorder: Secondary | ICD-10-CM | POA: Diagnosis not present

## 2016-10-28 DIAGNOSIS — N951 Menopausal and female climacteric states: Secondary | ICD-10-CM | POA: Diagnosis not present

## 2016-10-28 DIAGNOSIS — J329 Chronic sinusitis, unspecified: Secondary | ICD-10-CM | POA: Diagnosis not present

## 2016-10-28 DIAGNOSIS — Z6827 Body mass index (BMI) 27.0-27.9, adult: Secondary | ICD-10-CM | POA: Diagnosis not present

## 2016-11-09 ENCOUNTER — Telehealth: Payer: Self-pay | Admitting: Gastroenterology

## 2016-11-09 ENCOUNTER — Ambulatory Visit: Payer: Federal, State, Local not specified - PPO | Admitting: Gastroenterology

## 2016-11-09 ENCOUNTER — Encounter: Payer: Self-pay | Admitting: Gastroenterology

## 2016-11-09 NOTE — Telephone Encounter (Signed)
PATIENT WAS A NO SHOW AND LETTER SENT  °

## 2016-11-11 DIAGNOSIS — Z803 Family history of malignant neoplasm of breast: Secondary | ICD-10-CM | POA: Diagnosis not present

## 2016-11-11 DIAGNOSIS — Z1231 Encounter for screening mammogram for malignant neoplasm of breast: Secondary | ICD-10-CM | POA: Diagnosis not present

## 2016-12-17 DIAGNOSIS — Z1389 Encounter for screening for other disorder: Secondary | ICD-10-CM | POA: Diagnosis not present

## 2016-12-17 DIAGNOSIS — H6691 Otitis media, unspecified, right ear: Secondary | ICD-10-CM | POA: Diagnosis not present

## 2016-12-17 DIAGNOSIS — E663 Overweight: Secondary | ICD-10-CM | POA: Diagnosis not present

## 2016-12-17 DIAGNOSIS — Z6827 Body mass index (BMI) 27.0-27.9, adult: Secondary | ICD-10-CM | POA: Diagnosis not present

## 2017-02-19 DIAGNOSIS — R5382 Chronic fatigue, unspecified: Secondary | ICD-10-CM | POA: Diagnosis not present

## 2017-02-19 DIAGNOSIS — N951 Menopausal and female climacteric states: Secondary | ICD-10-CM | POA: Diagnosis not present

## 2017-05-27 DIAGNOSIS — N951 Menopausal and female climacteric states: Secondary | ICD-10-CM | POA: Diagnosis not present

## 2017-06-15 DIAGNOSIS — M545 Low back pain: Secondary | ICD-10-CM | POA: Diagnosis not present

## 2017-06-15 DIAGNOSIS — M4726 Other spondylosis with radiculopathy, lumbar region: Secondary | ICD-10-CM | POA: Diagnosis not present

## 2017-06-15 DIAGNOSIS — M5136 Other intervertebral disc degeneration, lumbar region: Secondary | ICD-10-CM | POA: Diagnosis not present

## 2017-06-15 DIAGNOSIS — M48062 Spinal stenosis, lumbar region with neurogenic claudication: Secondary | ICD-10-CM | POA: Diagnosis not present

## 2017-06-15 DIAGNOSIS — M4155 Other secondary scoliosis, thoracolumbar region: Secondary | ICD-10-CM | POA: Diagnosis not present

## 2017-07-07 DIAGNOSIS — H6061 Unspecified chronic otitis externa, right ear: Secondary | ICD-10-CM | POA: Diagnosis not present

## 2017-07-07 DIAGNOSIS — Z1389 Encounter for screening for other disorder: Secondary | ICD-10-CM | POA: Diagnosis not present

## 2017-07-07 DIAGNOSIS — H6121 Impacted cerumen, right ear: Secondary | ICD-10-CM | POA: Diagnosis not present

## 2017-07-07 DIAGNOSIS — H6691 Otitis media, unspecified, right ear: Secondary | ICD-10-CM | POA: Diagnosis not present

## 2017-07-07 DIAGNOSIS — Z6827 Body mass index (BMI) 27.0-27.9, adult: Secondary | ICD-10-CM | POA: Diagnosis not present

## 2017-07-07 DIAGNOSIS — E663 Overweight: Secondary | ICD-10-CM | POA: Diagnosis not present

## 2017-09-27 DIAGNOSIS — N951 Menopausal and female climacteric states: Secondary | ICD-10-CM | POA: Diagnosis not present

## 2018-01-06 DIAGNOSIS — M3210 Systemic lupus erythematosus, organ or system involvement unspecified: Secondary | ICD-10-CM | POA: Diagnosis not present

## 2018-01-06 DIAGNOSIS — Z0001 Encounter for general adult medical examination with abnormal findings: Secondary | ICD-10-CM | POA: Diagnosis not present

## 2018-01-06 DIAGNOSIS — K219 Gastro-esophageal reflux disease without esophagitis: Secondary | ICD-10-CM | POA: Diagnosis not present

## 2018-01-06 DIAGNOSIS — Z6828 Body mass index (BMI) 28.0-28.9, adult: Secondary | ICD-10-CM | POA: Diagnosis not present

## 2018-01-06 DIAGNOSIS — E663 Overweight: Secondary | ICD-10-CM | POA: Diagnosis not present

## 2018-01-06 DIAGNOSIS — Z1389 Encounter for screening for other disorder: Secondary | ICD-10-CM | POA: Diagnosis not present

## 2018-06-14 DIAGNOSIS — M5136 Other intervertebral disc degeneration, lumbar region: Secondary | ICD-10-CM | POA: Diagnosis not present

## 2018-06-14 DIAGNOSIS — M48062 Spinal stenosis, lumbar region with neurogenic claudication: Secondary | ICD-10-CM | POA: Diagnosis not present

## 2018-06-14 DIAGNOSIS — M4726 Other spondylosis with radiculopathy, lumbar region: Secondary | ICD-10-CM | POA: Diagnosis not present

## 2018-06-14 DIAGNOSIS — M4155 Other secondary scoliosis, thoracolumbar region: Secondary | ICD-10-CM | POA: Diagnosis not present

## 2018-06-24 DIAGNOSIS — R5383 Other fatigue: Secondary | ICD-10-CM | POA: Diagnosis not present

## 2018-06-24 DIAGNOSIS — R7309 Other abnormal glucose: Secondary | ICD-10-CM | POA: Diagnosis not present

## 2018-06-24 DIAGNOSIS — R739 Hyperglycemia, unspecified: Secondary | ICD-10-CM | POA: Diagnosis not present

## 2018-06-24 DIAGNOSIS — E7849 Other hyperlipidemia: Secondary | ICD-10-CM | POA: Diagnosis not present

## 2018-06-24 DIAGNOSIS — F419 Anxiety disorder, unspecified: Secondary | ICD-10-CM | POA: Diagnosis not present

## 2018-06-24 DIAGNOSIS — Z6829 Body mass index (BMI) 29.0-29.9, adult: Secondary | ICD-10-CM | POA: Diagnosis not present

## 2018-06-29 DIAGNOSIS — N951 Menopausal and female climacteric states: Secondary | ICD-10-CM | POA: Diagnosis not present
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
## 2018-07-27 ENCOUNTER — Telehealth (HOSPITAL_COMMUNITY): Payer: Self-pay | Admitting: *Deleted

## 2018-07-27 ENCOUNTER — Other Ambulatory Visit: Payer: Self-pay

## 2018-07-27 ENCOUNTER — Encounter (HOSPITAL_COMMUNITY): Payer: Self-pay | Admitting: Psychiatry

## 2018-07-27 ENCOUNTER — Ambulatory Visit (INDEPENDENT_AMBULATORY_CARE_PROVIDER_SITE_OTHER): Payer: Federal, State, Local not specified - PPO | Admitting: Psychiatry

## 2018-07-27 DIAGNOSIS — F331 Major depressive disorder, recurrent, moderate: Secondary | ICD-10-CM | POA: Diagnosis not present

## 2018-07-27 DIAGNOSIS — F4310 Post-traumatic stress disorder, unspecified: Secondary | ICD-10-CM | POA: Diagnosis not present

## 2018-07-27 MED ORDER — LORAZEPAM 0.5 MG PO TABS: 0.5000 mg | ORAL_TABLET | Freq: Two times a day (BID) | ORAL | 0 refills | Status: DC | PRN

## 2018-07-27 MED ORDER — VENLAFAXINE HCL ER 37.5 MG PO CP24: ORAL_CAPSULE | ORAL | 0 refills | Status: DC

## 2018-07-27 NOTE — Patient Instructions (Signed)
1. Decrease citalopram 5 mg daily for one week, then discontinue 2. Start venlafaxine 37.5 mg daily for one week, then 75 mg daily  3. Start lorazepam 0.5 mg twice a day as needed for anxiety  4. Next appointment: 7/22 at 11:30 for 30 mins, video 5. Referral to therapy

## 2018-07-27 NOTE — Telephone Encounter (Signed)
CALLED TO MAKE THERAPY APPOINTMENT WITH PEGGY

## 2018-08-15 ENCOUNTER — Ambulatory Visit (INDEPENDENT_AMBULATORY_CARE_PROVIDER_SITE_OTHER): Payer: Federal, State, Local not specified - PPO | Admitting: Psychiatry

## 2018-08-15 ENCOUNTER — Encounter (HOSPITAL_COMMUNITY): Payer: Self-pay | Admitting: Psychiatry

## 2018-08-15 DIAGNOSIS — F4310 Post-traumatic stress disorder, unspecified: Secondary | ICD-10-CM

## 2018-08-15 DIAGNOSIS — F331 Major depressive disorder, recurrent, moderate: Secondary | ICD-10-CM
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
## 2018-08-24 ENCOUNTER — Encounter (HOSPITAL_COMMUNITY): Payer: Self-pay | Admitting: Psychiatry

## 2018-08-24 ENCOUNTER — Other Ambulatory Visit: Payer: Self-pay

## 2018-08-24 ENCOUNTER — Ambulatory Visit (INDEPENDENT_AMBULATORY_CARE_PROVIDER_SITE_OTHER): Payer: Federal, State, Local not specified - PPO | Admitting: Psychiatry

## 2018-08-24 DIAGNOSIS — F4310 Post-traumatic stress disorder, unspecified: Secondary | ICD-10-CM | POA: Diagnosis not present

## 2018-08-24 DIAGNOSIS — F321 Major depressive disorder, single episode, moderate: Secondary | ICD-10-CM | POA: Diagnosis not present

## 2018-08-24 MED ORDER — LORAZEPAM 0.5 MG PO TABS: 0.5000 mg | ORAL_TABLET | Freq: Two times a day (BID) | ORAL | 0 refills | Status: DC | PRN

## 2018-08-24 MED ORDER — SERTRALINE HCL 50 MG PO TABS: ORAL_TABLET | ORAL | 0 refills | Status: DC

## 2018-08-24 NOTE — Patient Instructions (Signed)
1. Decrease venlafaxine 37.5 mg daily for one week, then discontinue 2. Start sertraline 25 mg daily for one week, then 50 mg daily  3. Continue lorazepam 0.5 mg twice a day as needed for anxiety  4. Next appointment: 8/21 at 11:30

## 2018-08-29 DIAGNOSIS — M329 Systemic lupus erythematosus, unspecified: Secondary | ICD-10-CM | POA: Diagnosis not present

## 2018-08-29 DIAGNOSIS — F4323 Adjustment disorder with mixed anxiety and depressed mood: Secondary | ICD-10-CM | POA: Diagnosis not present

## 2018-08-29 DIAGNOSIS — E782 Mixed hyperlipidemia: Secondary | ICD-10-CM | POA: Diagnosis not present

## 2018-08-29 DIAGNOSIS — K219 Gastro-esophageal reflux disease without esophagitis: Secondary | ICD-10-CM | POA: Diagnosis not present

## 2018-08-30 ENCOUNTER — Ambulatory Visit (INDEPENDENT_AMBULATORY_CARE_PROVIDER_SITE_OTHER): Payer: Federal, State, Local not specified - PPO | Admitting: Psychiatry

## 2018-08-30 ENCOUNTER — Other Ambulatory Visit: Payer: Self-pay

## 2018-08-30 DIAGNOSIS — F4310 Post-traumatic stress disorder, unspecified: Secondary | ICD-10-CM

## 2018-08-30 DIAGNOSIS — F321 Major depressive disorder, single episode, moderate: Secondary | ICD-10-CM | POA: Diagnosis not present
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
## 2018-09-01 DIAGNOSIS — K219 Gastro-esophageal reflux disease without esophagitis: Secondary | ICD-10-CM | POA: Insufficient documentation

## 2018-09-01 DIAGNOSIS — E782 Mixed hyperlipidemia: Secondary | ICD-10-CM | POA: Insufficient documentation

## 2018-09-01 DIAGNOSIS — IMO0002 Reserved for concepts with insufficient information to code with codable children: Secondary | ICD-10-CM | POA: Insufficient documentation

## 2018-09-01 DIAGNOSIS — F4323 Adjustment disorder with mixed anxiety and depressed mood: Secondary | ICD-10-CM | POA: Insufficient documentation

## 2018-09-13 ENCOUNTER — Telehealth (HOSPITAL_COMMUNITY): Payer: Self-pay | Admitting: *Deleted

## 2018-09-13 ENCOUNTER — Other Ambulatory Visit: Payer: Self-pay

## 2018-09-13 ENCOUNTER — Ambulatory Visit (INDEPENDENT_AMBULATORY_CARE_PROVIDER_SITE_OTHER): Payer: Federal, State, Local not specified - PPO | Admitting: Psychiatry

## 2018-09-13 DIAGNOSIS — F321 Major depressive disorder, single episode, moderate: Secondary | ICD-10-CM | POA: Diagnosis not present

## 2018-09-13 DIAGNOSIS — F4310 Post-traumatic stress disorder, unspecified: Secondary | ICD-10-CM | POA: Diagnosis not present

## 2018-09-13 NOTE — Telephone Encounter (Signed)
It is fine. Advise her to stay on the current dose until the next appointment.

## 2018-09-13 NOTE — Telephone Encounter (Signed)
Patient called stated she cut the Sertraline back to 1/2 last night due to side effects, having bad headaches, urinating often. Stated she is feeling better today. And would like to keep it cut back to 1/2 for awhile
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
## 2018-09-13 NOTE — Telephone Encounter (Signed)
Patient called stated she cut the Sertraline back to 1/2 last night due to side effects, having bad headaches, urinating often. Stated she is feeling better today. And would like to keep it cut back to 1/2 for awhile  
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
## 2018-09-23 ENCOUNTER — Encounter (HOSPITAL_COMMUNITY): Payer: Self-pay | Admitting: Psychiatry

## 2018-09-23 ENCOUNTER — Other Ambulatory Visit: Payer: Self-pay

## 2018-09-23 ENCOUNTER — Ambulatory Visit (INDEPENDENT_AMBULATORY_CARE_PROVIDER_SITE_OTHER): Payer: Federal, State, Local not specified - PPO | Admitting: Psychiatry

## 2018-09-23 DIAGNOSIS — F4310 Post-traumatic stress disorder, unspecified: Secondary | ICD-10-CM | POA: Diagnosis not present

## 2018-09-23 DIAGNOSIS — F324 Major depressive disorder, single episode, in partial remission: Secondary | ICD-10-CM

## 2018-09-23 DIAGNOSIS — F410 Panic disorder [episodic paroxysmal anxiety] without agoraphobia: Secondary | ICD-10-CM | POA: Diagnosis not present

## 2018-09-23 NOTE — Patient Instructions (Signed)
1. Discontinue sertraline  2. Discontinue lorazepam  3. Return to clinic as needed

## 2018-09-27 ENCOUNTER — Ambulatory Visit (INDEPENDENT_AMBULATORY_CARE_PROVIDER_SITE_OTHER): Payer: Federal, State, Local not specified - PPO | Admitting: Psychiatry

## 2018-09-27 ENCOUNTER — Other Ambulatory Visit: Payer: Self-pay

## 2018-09-27 DIAGNOSIS — F324 Major depressive disorder, single episode, in partial remission: Secondary | ICD-10-CM | POA: Diagnosis not present

## 2018-09-27 DIAGNOSIS — F4310 Post-traumatic stress disorder, unspecified: Secondary | ICD-10-CM | POA: Diagnosis not present
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
## 2018-10-11 ENCOUNTER — Other Ambulatory Visit: Payer: Self-pay

## 2018-10-11 ENCOUNTER — Ambulatory Visit (INDEPENDENT_AMBULATORY_CARE_PROVIDER_SITE_OTHER): Payer: Federal, State, Local not specified - PPO | Admitting: Psychiatry

## 2018-10-11 DIAGNOSIS — F324 Major depressive disorder, single episode, in partial remission: Secondary | ICD-10-CM | POA: Diagnosis not present

## 2018-10-11 DIAGNOSIS — F4310 Post-traumatic stress disorder, unspecified: Secondary | ICD-10-CM | POA: Diagnosis not present
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
## 2018-10-25 ENCOUNTER — Other Ambulatory Visit: Payer: Self-pay

## 2018-10-25 ENCOUNTER — Ambulatory Visit (INDEPENDENT_AMBULATORY_CARE_PROVIDER_SITE_OTHER): Payer: Federal, State, Local not specified - PPO | Admitting: Psychiatry

## 2018-10-25 DIAGNOSIS — F4310 Post-traumatic stress disorder, unspecified: Secondary | ICD-10-CM | POA: Diagnosis not present

## 2018-10-25 DIAGNOSIS — F324 Major depressive disorder, single episode, in partial remission: Secondary | ICD-10-CM
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
## 2018-11-08 ENCOUNTER — Ambulatory Visit (INDEPENDENT_AMBULATORY_CARE_PROVIDER_SITE_OTHER): Payer: Federal, State, Local not specified - PPO | Admitting: Psychiatry

## 2018-11-08 ENCOUNTER — Other Ambulatory Visit: Payer: Self-pay

## 2018-11-08 DIAGNOSIS — F324 Major depressive disorder, single episode, in partial remission: Secondary | ICD-10-CM | POA: Diagnosis not present

## 2018-11-08 DIAGNOSIS — F4310 Post-traumatic stress disorder, unspecified: Secondary | ICD-10-CM | POA: Diagnosis not present
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
## 2018-11-22 ENCOUNTER — Other Ambulatory Visit: Payer: Self-pay

## 2018-11-22 ENCOUNTER — Ambulatory Visit (INDEPENDENT_AMBULATORY_CARE_PROVIDER_SITE_OTHER): Payer: Federal, State, Local not specified - PPO | Admitting: Psychiatry

## 2018-11-22 DIAGNOSIS — F4310 Post-traumatic stress disorder, unspecified: Secondary | ICD-10-CM | POA: Diagnosis not present

## 2018-11-22 DIAGNOSIS — F324 Major depressive disorder, single episode, in partial remission: Secondary | ICD-10-CM
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
## 2018-12-06 ENCOUNTER — Ambulatory Visit (HOSPITAL_COMMUNITY): Payer: Federal, State, Local not specified - PPO | Admitting: Psychiatry

## 2018-12-26 DIAGNOSIS — Z1389 Encounter for screening for other disorder: Secondary | ICD-10-CM | POA: Diagnosis not present

## 2018-12-26 DIAGNOSIS — Z131 Encounter for screening for diabetes mellitus: Secondary | ICD-10-CM | POA: Diagnosis not present

## 2018-12-26 DIAGNOSIS — Z13220 Encounter for screening for lipoid disorders: Secondary | ICD-10-CM | POA: Diagnosis not present

## 2018-12-26 DIAGNOSIS — Z0000 Encounter for general adult medical examination without abnormal findings: Secondary | ICD-10-CM | POA: Diagnosis not present

## 2018-12-26 DIAGNOSIS — M329 Systemic lupus erythematosus, unspecified: Secondary | ICD-10-CM | POA: Diagnosis not present

## 2019-01-02 DIAGNOSIS — Z0000 Encounter for general adult medical examination without abnormal findings: Secondary | ICD-10-CM | POA: Diagnosis not present

## 2019-01-02 DIAGNOSIS — N951 Menopausal and female climacteric states: Secondary | ICD-10-CM | POA: Diagnosis not present

## 2019-01-02 DIAGNOSIS — E782 Mixed hyperlipidemia: Secondary | ICD-10-CM | POA: Diagnosis not present

## 2019-01-02 DIAGNOSIS — K219 Gastro-esophageal reflux disease without esophagitis: Secondary | ICD-10-CM | POA: Diagnosis not present

## 2019-01-02 DIAGNOSIS — Z124 Encounter for screening for malignant neoplasm of cervix: Secondary | ICD-10-CM | POA: Diagnosis not present

## 2019-01-02 DIAGNOSIS — Z1231 Encounter for screening mammogram for malignant neoplasm of breast: Secondary | ICD-10-CM | POA: Diagnosis not present

## 2019-01-09 ENCOUNTER — Ambulatory Visit (HOSPITAL_COMMUNITY): Payer: Federal, State, Local not specified - PPO | Admitting: Psychiatry

## 2019-04-14 ENCOUNTER — Ambulatory Visit: Payer: Federal, State, Local not specified - PPO | Attending: Internal Medicine

## 2019-04-17 ENCOUNTER — Other Ambulatory Visit: Payer: Federal, State, Local not specified - PPO

## 2019-05-08 DIAGNOSIS — Z23 Encounter for immunization: Secondary | ICD-10-CM | POA: Diagnosis not present

## 2019-05-08 DIAGNOSIS — L089 Local infection of the skin and subcutaneous tissue, unspecified: Secondary | ICD-10-CM | POA: Diagnosis not present

## 2019-05-08 DIAGNOSIS — S61431A Puncture wound without foreign body of right hand, initial encounter: Secondary | ICD-10-CM | POA: Diagnosis not present

## 2019-05-08 DIAGNOSIS — R600 Localized edema: Secondary | ICD-10-CM | POA: Diagnosis not present

## 2019-05-11 DIAGNOSIS — L089 Local infection of the skin and subcutaneous tissue, unspecified: Secondary | ICD-10-CM | POA: Diagnosis not present

## 2019-05-11 DIAGNOSIS — S61431D Puncture wound without foreign body of right hand, subsequent encounter: Secondary | ICD-10-CM | POA: Diagnosis not present

## 2019-06-05 DIAGNOSIS — M25551 Pain in right hip: Secondary | ICD-10-CM | POA: Diagnosis not present

## 2019-06-05 DIAGNOSIS — M25552 Pain in left hip: Secondary | ICD-10-CM | POA: Diagnosis not present

## 2019-06-05 DIAGNOSIS — R531 Weakness: Secondary | ICD-10-CM | POA: Diagnosis not present

## 2019-06-05 DIAGNOSIS — M545 Low back pain: Secondary | ICD-10-CM | POA: Diagnosis not present

## 2019-06-07 DIAGNOSIS — M25551 Pain in right hip: Secondary | ICD-10-CM | POA: Diagnosis not present

## 2019-06-07 DIAGNOSIS — M25552 Pain in left hip: Secondary | ICD-10-CM | POA: Diagnosis not present

## 2019-06-07 DIAGNOSIS — R531 Weakness: Secondary | ICD-10-CM | POA: Diagnosis not present

## 2019-06-07 DIAGNOSIS — M545 Low back pain: Secondary | ICD-10-CM | POA: Diagnosis not present

## 2019-06-09 DIAGNOSIS — M545 Low back pain: Secondary | ICD-10-CM | POA: Diagnosis not present

## 2019-06-09 DIAGNOSIS — M25552 Pain in left hip: Secondary | ICD-10-CM | POA: Diagnosis not present

## 2019-06-09 DIAGNOSIS — R531 Weakness: Secondary | ICD-10-CM | POA: Diagnosis not present

## 2019-06-09 DIAGNOSIS — M25551 Pain in right hip: Secondary | ICD-10-CM | POA: Diagnosis not present

## 2019-06-12 DIAGNOSIS — M25552 Pain in left hip: Secondary | ICD-10-CM | POA: Diagnosis not present

## 2019-06-12 DIAGNOSIS — M545 Low back pain: Secondary | ICD-10-CM | POA: Diagnosis not present

## 2019-06-12 DIAGNOSIS — Z1231 Encounter for screening mammogram for malignant neoplasm of breast: Secondary | ICD-10-CM | POA: Diagnosis not present

## 2019-06-12 DIAGNOSIS — Z23 Encounter for immunization: Secondary | ICD-10-CM | POA: Diagnosis not present

## 2019-06-12 DIAGNOSIS — M25551 Pain in right hip: Secondary | ICD-10-CM | POA: Diagnosis not present

## 2019-06-12 DIAGNOSIS — R531 Weakness: Secondary | ICD-10-CM | POA: Diagnosis not present

## 2019-06-15 DIAGNOSIS — M25552 Pain in left hip: Secondary | ICD-10-CM | POA: Diagnosis not present

## 2019-06-15 DIAGNOSIS — M25551 Pain in right hip: Secondary | ICD-10-CM | POA: Diagnosis not present

## 2019-06-15 DIAGNOSIS — R531 Weakness: Secondary | ICD-10-CM | POA: Diagnosis not present

## 2019-06-15 DIAGNOSIS — M545 Low back pain: Secondary | ICD-10-CM | POA: Diagnosis not present

## 2019-06-20 DIAGNOSIS — M25551 Pain in right hip: Secondary | ICD-10-CM | POA: Diagnosis not present

## 2019-06-20 DIAGNOSIS — M545 Low back pain: Secondary | ICD-10-CM | POA: Diagnosis not present

## 2019-06-20 DIAGNOSIS — M25552 Pain in left hip: Secondary | ICD-10-CM | POA: Diagnosis not present

## 2019-06-20 DIAGNOSIS — R531 Weakness: Secondary | ICD-10-CM | POA: Diagnosis not present

## 2019-06-26 DIAGNOSIS — M545 Low back pain: Secondary | ICD-10-CM | POA: Diagnosis not present

## 2019-06-26 DIAGNOSIS — M25552 Pain in left hip: Secondary | ICD-10-CM | POA: Diagnosis not present

## 2019-06-26 DIAGNOSIS — R531 Weakness: Secondary | ICD-10-CM | POA: Diagnosis not present

## 2019-06-26 DIAGNOSIS — M25551 Pain in right hip: Secondary | ICD-10-CM | POA: Diagnosis not present

## 2019-07-04 DIAGNOSIS — M545 Low back pain: Secondary | ICD-10-CM | POA: Diagnosis not present

## 2019-07-04 DIAGNOSIS — M25552 Pain in left hip: Secondary | ICD-10-CM | POA: Diagnosis not present

## 2019-07-04 DIAGNOSIS — R531 Weakness: Secondary | ICD-10-CM | POA: Diagnosis not present

## 2019-07-04 DIAGNOSIS — M25551 Pain in right hip: Secondary | ICD-10-CM | POA: Diagnosis not present

## 2019-07-05 DIAGNOSIS — R7303 Prediabetes: Secondary | ICD-10-CM | POA: Diagnosis not present

## 2019-07-05 DIAGNOSIS — G5700 Lesion of sciatic nerve, unspecified lower limb: Secondary | ICD-10-CM | POA: Diagnosis not present

## 2019-07-05 DIAGNOSIS — K219 Gastro-esophageal reflux disease without esophagitis: Secondary | ICD-10-CM | POA: Diagnosis not present

## 2019-07-05 DIAGNOSIS — E782 Mixed hyperlipidemia: Secondary | ICD-10-CM | POA: Diagnosis not present

## 2019-07-07 DIAGNOSIS — R531 Weakness: Secondary | ICD-10-CM | POA: Diagnosis not present

## 2019-07-07 DIAGNOSIS — M545 Low back pain: Secondary | ICD-10-CM | POA: Diagnosis not present

## 2019-07-07 DIAGNOSIS — M25552 Pain in left hip: Secondary | ICD-10-CM | POA: Diagnosis not present

## 2019-07-07 DIAGNOSIS — M25551 Pain in right hip: Secondary | ICD-10-CM | POA: Diagnosis not present

## 2019-07-11 DIAGNOSIS — M545 Low back pain: Secondary | ICD-10-CM | POA: Diagnosis not present

## 2019-07-11 DIAGNOSIS — R531 Weakness: Secondary | ICD-10-CM | POA: Diagnosis not present

## 2019-07-11 DIAGNOSIS — M25551 Pain in right hip: Secondary | ICD-10-CM | POA: Diagnosis not present

## 2019-07-11 DIAGNOSIS — M25552 Pain in left hip: Secondary | ICD-10-CM | POA: Diagnosis not present

## 2019-07-20 DIAGNOSIS — M25551 Pain in right hip: Secondary | ICD-10-CM | POA: Diagnosis not present

## 2019-07-20 DIAGNOSIS — M545 Low back pain: Secondary | ICD-10-CM | POA: Diagnosis not present

## 2019-07-20 DIAGNOSIS — M25552 Pain in left hip: Secondary | ICD-10-CM | POA: Diagnosis not present

## 2019-07-20 DIAGNOSIS — R531 Weakness: Secondary | ICD-10-CM | POA: Diagnosis not present

## 2019-07-25 DIAGNOSIS — M545 Low back pain: Secondary | ICD-10-CM | POA: Diagnosis not present

## 2019-07-25 DIAGNOSIS — M25552 Pain in left hip: Secondary | ICD-10-CM | POA: Diagnosis not present

## 2019-07-25 DIAGNOSIS — R531 Weakness: Secondary | ICD-10-CM | POA: Diagnosis not present

## 2019-07-25 DIAGNOSIS — M25551 Pain in right hip: Secondary | ICD-10-CM | POA: Diagnosis not present

## 2019-07-27 DIAGNOSIS — R531 Weakness: Secondary | ICD-10-CM | POA: Diagnosis not present

## 2019-07-27 DIAGNOSIS — M25552 Pain in left hip: Secondary | ICD-10-CM | POA: Diagnosis not present

## 2019-07-27 DIAGNOSIS — M545 Low back pain: Secondary | ICD-10-CM | POA: Diagnosis not present

## 2019-07-27 DIAGNOSIS — M25551 Pain in right hip: Secondary | ICD-10-CM | POA: Diagnosis not present

## 2019-07-28 DIAGNOSIS — E782 Mixed hyperlipidemia: Secondary | ICD-10-CM | POA: Diagnosis not present

## 2019-07-28 DIAGNOSIS — R7303 Prediabetes: Secondary | ICD-10-CM | POA: Diagnosis not present

## 2019-07-28 DIAGNOSIS — K219 Gastro-esophageal reflux disease without esophagitis: Secondary | ICD-10-CM | POA: Diagnosis not present

## 2019-08-07 DIAGNOSIS — M25552 Pain in left hip: Secondary | ICD-10-CM | POA: Diagnosis not present

## 2019-08-07 DIAGNOSIS — R531 Weakness: Secondary | ICD-10-CM | POA: Diagnosis not present

## 2019-08-07 DIAGNOSIS — M545 Low back pain: Secondary | ICD-10-CM | POA: Diagnosis not present

## 2019-08-07 DIAGNOSIS — M25551 Pain in right hip: Secondary | ICD-10-CM | POA: Diagnosis not present

## 2019-08-09 DIAGNOSIS — R531 Weakness: Secondary | ICD-10-CM | POA: Diagnosis not present

## 2019-08-09 DIAGNOSIS — M25551 Pain in right hip: Secondary | ICD-10-CM | POA: Diagnosis not present

## 2019-08-09 DIAGNOSIS — M25552 Pain in left hip: Secondary | ICD-10-CM | POA: Diagnosis not present

## 2019-08-09 DIAGNOSIS — M545 Low back pain: Secondary | ICD-10-CM | POA: Diagnosis not present

## 2019-08-11 DIAGNOSIS — M545 Low back pain: Secondary | ICD-10-CM | POA: Diagnosis not present

## 2019-08-11 DIAGNOSIS — M25552 Pain in left hip: Secondary | ICD-10-CM | POA: Diagnosis not present

## 2019-08-11 DIAGNOSIS — M25551 Pain in right hip: Secondary | ICD-10-CM | POA: Diagnosis not present

## 2019-08-11 DIAGNOSIS — R531 Weakness: Secondary | ICD-10-CM | POA: Diagnosis not present

## 2019-08-21 DIAGNOSIS — M25552 Pain in left hip: Secondary | ICD-10-CM | POA: Diagnosis not present

## 2019-08-21 DIAGNOSIS — R531 Weakness: Secondary | ICD-10-CM | POA: Diagnosis not present

## 2019-08-21 DIAGNOSIS — M25551 Pain in right hip: Secondary | ICD-10-CM | POA: Diagnosis not present

## 2019-08-21 DIAGNOSIS — M545 Low back pain: Secondary | ICD-10-CM | POA: Diagnosis not present

## 2019-08-23 DIAGNOSIS — M545 Low back pain: Secondary | ICD-10-CM | POA: Diagnosis not present

## 2019-08-23 DIAGNOSIS — M25552 Pain in left hip: Secondary | ICD-10-CM | POA: Diagnosis not present

## 2019-08-23 DIAGNOSIS — M25551 Pain in right hip: Secondary | ICD-10-CM | POA: Diagnosis not present

## 2019-08-23 DIAGNOSIS — R531 Weakness: Secondary | ICD-10-CM | POA: Diagnosis not present

## 2019-08-24 DIAGNOSIS — S7011XA Contusion of right thigh, initial encounter: Secondary | ICD-10-CM | POA: Diagnosis not present

## 2019-08-24 DIAGNOSIS — S8011XA Contusion of right lower leg, initial encounter: Secondary | ICD-10-CM | POA: Diagnosis not present

## 2020-04-08 ENCOUNTER — Other Ambulatory Visit: Payer: Self-pay | Admitting: Orthopedic Surgery

## 2020-04-08 DIAGNOSIS — M24851 Other specific joint derangements of right hip, not elsewhere classified: Secondary | ICD-10-CM

## 2020-04-09 DIAGNOSIS — Z79899 Other long term (current) drug therapy: Secondary | ICD-10-CM | POA: Insufficient documentation

## 2020-04-09 DIAGNOSIS — M3500 Sicca syndrome, unspecified: Secondary | ICD-10-CM | POA: Insufficient documentation

## 2020-04-09 DIAGNOSIS — M791 Myalgia, unspecified site: Secondary | ICD-10-CM | POA: Insufficient documentation

## 2020-04-09 DIAGNOSIS — R5382 Chronic fatigue, unspecified: Secondary | ICD-10-CM | POA: Insufficient documentation

## 2020-04-18 ENCOUNTER — Ambulatory Visit (HOSPITAL_COMMUNITY)
Admission: RE | Admit: 2020-04-18 | Discharge: 2020-04-18 | Disposition: A | Discharge status: Home / Self Care | Payer: Medicare Other | Source: Ambulatory Visit | Attending: Orthopedic Surgery | Admitting: Orthopedic Surgery

## 2020-04-18 ENCOUNTER — Other Ambulatory Visit: Payer: Self-pay

## 2020-04-18 DIAGNOSIS — M24851 Other specific joint derangements of right hip, not elsewhere classified: Secondary | ICD-10-CM

## 2020-04-18 DIAGNOSIS — M24852 Other specific joint derangements of left hip, not elsewhere classified: Secondary | ICD-10-CM | POA: Diagnosis present

## 2020-06-13 DIAGNOSIS — S76309A Unspecified injury of muscle, fascia and tendon of the posterior muscle group at thigh level, unspecified thigh, initial encounter: Secondary | ICD-10-CM | POA: Insufficient documentation

## 2020-08-12 ENCOUNTER — Other Ambulatory Visit: Payer: Self-pay | Admitting: Physician Assistant

## 2020-08-12 DIAGNOSIS — Z1231 Encounter for screening mammogram for malignant neoplasm of breast: Secondary | ICD-10-CM

## 2021-04-03 ENCOUNTER — Other Ambulatory Visit: Payer: Self-pay | Admitting: Otolaryngology

## 2021-04-03 DIAGNOSIS — J3801 Paralysis of vocal cords and larynx, unilateral: Secondary | ICD-10-CM

## 2021-04-15 ENCOUNTER — Other Ambulatory Visit: Payer: Self-pay

## 2021-04-15 ENCOUNTER — Ambulatory Visit
Admission: RE | Admit: 2021-04-15 | Discharge: 2021-04-15 | Disposition: A | Discharge status: Home / Self Care | Payer: Medicare Other | Source: Ambulatory Visit | Attending: Otolaryngology | Admitting: Otolaryngology

## 2021-04-15 DIAGNOSIS — J3801 Paralysis of vocal cords and larynx, unilateral: Secondary | ICD-10-CM

## 2021-04-15 MED ORDER — IOPAMIDOL (ISOVUE-300) INJECTION 61%
100.0000 mL | Freq: Once | INTRAVENOUS | Status: AC | PRN
  Administered 2021-04-15: 100 mL via INTRAVENOUS

## 2021-05-07 ENCOUNTER — Other Ambulatory Visit: Payer: Self-pay | Admitting: Orthopedic Surgery

## 2021-05-07 DIAGNOSIS — M7989 Other specified soft tissue disorders: Secondary | ICD-10-CM

## 2021-05-14 ENCOUNTER — Encounter (INDEPENDENT_AMBULATORY_CARE_PROVIDER_SITE_OTHER): Payer: Self-pay | Admitting: Nurse Practitioner

## 2021-05-14 ENCOUNTER — Ambulatory Visit (INDEPENDENT_AMBULATORY_CARE_PROVIDER_SITE_OTHER): Payer: Medicare Other | Admitting: Nurse Practitioner

## 2021-05-14 VITALS — BP 126/90 | HR 81 | Resp 17 | Ht 64.0 in | Wt 154.0 lb

## 2021-05-14 DIAGNOSIS — I771 Stricture of artery: Secondary | ICD-10-CM | POA: Diagnosis not present

## 2021-05-15 ENCOUNTER — Ambulatory Visit
Admission: RE | Admit: 2021-05-15 | Discharge: 2021-05-15 | Disposition: A | Discharge status: Home / Self Care | Payer: Medicare Other | Source: Ambulatory Visit | Attending: Orthopedic Surgery | Admitting: Orthopedic Surgery

## 2021-05-15 ENCOUNTER — Encounter: Payer: Self-pay | Admitting: Gastroenterology

## 2021-05-15 DIAGNOSIS — M7989 Other specified soft tissue disorders: Secondary | ICD-10-CM | POA: Diagnosis present

## 2021-05-15 MED ORDER — GADOBUTROL 1 MMOL/ML IV SOLN
6.0000 mL | Freq: Once | INTRAVENOUS | Status: AC | PRN
  Administered 2021-05-15: 6 mL via INTRAVENOUS

## 2021-05-15 NOTE — H&P (Signed)
? ?Pre-Procedure H&P ?  ?Patient ID: [name] is a 66 y.o. female. ? ?Gastroenterology Provider: Annamaria Helling, DO ? ?Referring Provider: Octavia Bruckner, PA ?PCP: Marinda Elk, MD ? ?Date: 05/16/2021 ? ?HPI ?[name] is a 66 y.o. female who presents today for Esophagogastroduodenoscopy for Acid reflux with history of Barrett's-surveillance. ?Patient with longstanding history of acid reflux well-controlled on PPI.  She has a reported history of Barrett's esophagus last evaluated by upper endoscopy in 2018 by [facility].  These records are not available at this time. ?Had a Nissen fundoplication in the early 2000's. ?Has increased belching and gas as of late treated by simethicone.  Also notes nighttime cough.  She denies any dysphagia or odynophagia. ?Loose stools at baseline with no melena or hematochezia ?No family history of GI disease or malignancy ?Personal history of lupus and Sjogren's syndrome. ?Also had EGD and colonoscopy in 2009 with hiatal hernia and normal respectively. ?She is currently pending ENT eval ?Most recent labs creatinine 0.6 hemoglobin 14.2 MCV 96.5 platelets 221,000 ? ?Past Medical History:  ?Diagnosis Date  ? Anemia   ? reportedly IDA; GIVENS performed by Dr. Benson Norway Oct 2009, no abnormalities  ? Anxiety   ? Carpal tunnel syndrome   ? Dizziness   ? GERD (gastroesophageal reflux disease)   ? Lupus ([facility]) 2012  ? S/P colonoscopy Sept 2009  ? normal colon, large hemorrhoids  ? S/P endoscopy Sept 2009  ? Dr. Benson Norway: hiatal hernia  ? Vertigo   ? ? ?Past Surgical History:  ?Procedure Laterality Date  ? ADENOIDECTOMY    ? bilateral foot surgery    ? CARPAL TUNNEL RELEASE Right 04/07/2016  ? Procedure: RIGHT CARPAL TUNNEL RELEASE;  Surgeon: Daryll Brod, MD;  Location: [facility];  Service: Orthopedics;  Laterality: Right;  REG/FAB  ? CHOLECYSTECTOMY    ? endoscopy and colonoscopy  2009  ? Dr. Benson Norway: hiatal hernia, normal colon, large hemorrhoids  ?  GIVENS CAPSULE STUDY  Oct 2009  ? Dr. Benson Norway: normal  ? KNEE SURGERY Right   ? NISSEN FUNDOPLICATION    ? NOSE SURGERY    ? ROTATOR CUFF REPAIR    ? TONSILLECTOMY    ? ? ?Family History ?No h/o GI disease or malignancy ? ?Review of Systems  ?Constitutional:  Negative for activity change, appetite change, chills, diaphoresis, fatigue, fever and unexpected weight change.  ?HENT:  Negative for trouble swallowing and voice change.   ?Respiratory:  Positive for cough (nighttime). Negative for shortness of breath and wheezing.   ?Cardiovascular:  Negative for chest pain, palpitations and leg swelling.  ?Gastrointestinal:  Negative for abdominal distention, abdominal pain, anal bleeding, blood in stool, constipation, diarrhea, nausea, rectal pain and vomiting.  ?     Belching and gas relieved by simethicone  ?Musculoskeletal:  Negative for arthralgias and myalgias.  ?Skin:  Negative for color change and pallor.  ?Neurological:  Negative for dizziness, syncope and weakness.  ?Psychiatric/Behavioral:  Negative for confusion.   ?All other systems reviewed and are negative.  ? ?Medications ?No current facility-administered medications on file prior to encounter.  ? ?Current Outpatient Medications on File Prior to Encounter  ?Medication Sig Dispense Refill  ? aspirin-acetaminophen-caffeine (EXCEDRIN MIGRAINE) 250-250-65 MG tablet Take 1 tablet by mouth every 6 (six) hours as needed for headache.    ? diclofenac (VOLTAREN) 75 MG EC tablet Take 75 mg by mouth as needed.     ? ? ?Pertinent medications related to GI  and procedure were reviewed by me with the patient prior to the procedure ? ? ?Current Facility-Administered Medications:  ?  0.9 %  sodium chloride infusion, , Intravenous, Continuous, Annamaria Helling, DO, Last Rate: 20 mL/hr at 05/16/21 1039, Continued from Pre-op at 05/16/21 1039 ?  ?  ? ?Allergies  ?Allergen Reactions  ? Bismuth Subsalicylate Nausea And Vomiting  ? Cephalexin Other (See Comments)  ?  Severe sore  throat   ? Codeine Nausea And Vomiting  ? Famotidine Nausea And Vomiting  ? ?Allergies were reviewed by me prior to the procedure ? ?Objective  ? ?Body mass index is 25.92 kg/m?. ?Vitals:  ? 05/16/21 1026  ?BP: 110/60  ?Pulse: 75  ?Resp: 18  ?Temp: 97.8 ?F (36.6 ?C)  ?TempSrc: Temporal  ?SpO2: 100%  ?Weight: 68.5 kg  ?Height: 5\' 4"  (1.626 m)  ? ? ? ?Physical Exam ?Vitals and nursing note reviewed.  ?Constitutional:   ?   General: She is not in acute distress. ?   Appearance: Normal appearance. She is not ill-appearing, toxic-appearing or diaphoretic.  ?HENT:  ?   Head: Normocephalic and atraumatic.  ?   Nose: Nose normal.  ?   Mouth/Throat:  ?   Mouth: Mucous membranes are moist.  ?   Pharynx: Oropharynx is clear.  ?Eyes:  ?   General: No scleral icterus. ?   Extraocular Movements: Extraocular movements intact.  ?   Comments: Ecchymosis over Left eye  ?Cardiovascular:  ?   Rate and Rhythm: Normal rate and regular rhythm.  ?   Heart sounds: Normal heart sounds. No murmur heard. ?  No friction rub. No gallop.  ?Pulmonary:  ?   Effort: Pulmonary effort is normal. No respiratory distress.  ?   Breath sounds: Normal breath sounds. No wheezing, rhonchi or rales.  ?Abdominal:  ?   General: Bowel sounds are normal. There is no distension.  ?   Palpations: Abdomen is soft.  ?   Tenderness: There is no abdominal tenderness. There is no guarding or rebound.  ?Musculoskeletal:  ?   Cervical back: Neck supple.  ?   Right lower leg: No edema.  ?   Left lower leg: No edema.  ?Skin: ?   General: Skin is warm and dry.  ?   Coloration: Skin is not jaundiced or pale.  ?Neurological:  ?   General: No focal deficit present.  ?   Mental Status: She is alert and oriented to person, place, and time. Mental status is at baseline.  ?Psychiatric:     ?   Mood and Affect: Mood normal.     ?   Behavior: Behavior normal.     ?   Thought Content: Thought content normal.     ?   Judgment: Judgment normal.  ? ? ? ?Assessment:  ?[name] is a 66 y.o. female  who presents today for Esophagogastroduodenoscopy for Acid reflux with history of Barrett's-surveillance. ? ?Plan:  ?Esophagogastroduodenoscopy with possible intervention today ? ?Esophagogastroduodenoscopy with possible biopsy, control of bleeding, polypectomy, and interventions as necessary has been discussed with the patient/patient representative. Informed consent was obtained from the patient/patient representative after explaining the indication, nature, and risks of the procedure including but not limited to death, bleeding, perforation, missed neoplasm/lesions, cardiorespiratory compromise, and reaction to medications. Opportunity for questions was given and appropriate answers were provided. Patient/patient representative has verbalized understanding is amenable to undergoing the procedure. ? ? ?Annamaria Helling, DO  ?[facility] ? ?  Portions of the record may have been created with voice recognition software. Occasional wrong-word or 'sound-a-like' substitutions may have occurred due to the inherent limitations of voice recognition software.  Read the chart carefully and recognize, using context, where substitutions may have occurred. ?

## 2021-05-16 ENCOUNTER — Encounter: Payer: Self-pay | Admitting: Gastroenterology

## 2021-05-16 ENCOUNTER — Ambulatory Visit
Admission: RE | Admit: 2021-05-16 | Discharge: 2021-05-16 | Disposition: A | Discharge status: Home / Self Care | Payer: Medicare Other | Attending: Gastroenterology | Admitting: Gastroenterology

## 2021-05-16 ENCOUNTER — Ambulatory Visit: Payer: Medicare Other | Admitting: Certified Registered Nurse Anesthetist

## 2021-05-16 ENCOUNTER — Other Ambulatory Visit: Payer: Self-pay

## 2021-05-16 ENCOUNTER — Encounter
Admission: RE | Disposition: A | Discharge status: Home / Self Care | Payer: Self-pay | Source: Home / Self Care | Attending: Gastroenterology

## 2021-05-16 DIAGNOSIS — K2289 Other specified disease of esophagus: Secondary | ICD-10-CM | POA: Insufficient documentation

## 2021-05-16 DIAGNOSIS — K2970 Gastritis, unspecified, without bleeding: Secondary | ICD-10-CM | POA: Insufficient documentation

## 2021-05-16 DIAGNOSIS — Z8719 Personal history of other diseases of the digestive system: Secondary | ICD-10-CM | POA: Insufficient documentation

## 2021-05-16 DIAGNOSIS — K449 Diaphragmatic hernia without obstruction or gangrene: Secondary | ICD-10-CM | POA: Insufficient documentation

## 2021-05-16 DIAGNOSIS — M3500 Sicca syndrome, unspecified: Secondary | ICD-10-CM | POA: Diagnosis not present

## 2021-05-16 DIAGNOSIS — M329 Systemic lupus erythematosus, unspecified: Secondary | ICD-10-CM | POA: Diagnosis not present

## 2021-05-16 DIAGNOSIS — Z87891 Personal history of nicotine dependence: Secondary | ICD-10-CM | POA: Diagnosis not present

## 2021-05-16 DIAGNOSIS — K219 Gastro-esophageal reflux disease without esophagitis: Secondary | ICD-10-CM | POA: Insufficient documentation

## 2021-05-16 HISTORY — PX: ESOPHAGOGASTRODUODENOSCOPY: SHX5428

## 2021-05-16 SURGERY — EGD (ESOPHAGOGASTRODUODENOSCOPY)
Anesthesia: General

## 2021-05-16 MED ORDER — SODIUM CHLORIDE 0.9 % IV SOLN: INTRAVENOUS | Status: DC

## 2021-05-16 MED ORDER — LIDOCAINE HCL (CARDIAC) PF 100 MG/5ML IV SOSY
PREFILLED_SYRINGE | INTRAVENOUS | Status: DC | PRN
Start: 2021-05-16 — End: 2021-05-16
  Administered 2021-05-16: 50 mg via INTRAVENOUS

## 2021-05-16 MED ORDER — PROPOFOL 10 MG/ML IV BOLUS
INTRAVENOUS | Status: AC
  Filled 2021-05-16: qty 20

## 2021-05-16 MED ORDER — PROPOFOL 10 MG/ML IV BOLUS
INTRAVENOUS | Status: DC | PRN
  Administered 2021-05-16: 60 mg via INTRAVENOUS
  Administered 2021-05-16: 40 mg via INTRAVENOUS

## 2021-05-16 MED ORDER — PROPOFOL 500 MG/50ML IV EMUL
INTRAVENOUS | Status: DC | PRN
Start: 2021-05-16 — End: 2021-05-16
  Administered 2021-05-16: 120 ug/kg/min via INTRAVENOUS

## 2021-05-16 NOTE — Anesthesia Preprocedure Evaluation (Signed)
Anesthesia Evaluation  ?Patient identified by MRN, date of birth, ID band ?Patient awake ? ? ? ?Reviewed: ?Allergy & Precautions, NPO status , Patient's Chart, lab work & pertinent test results ? ?Airway ?Mallampati: II ? ?TM Distance: <3 FB ?Neck ROM: Full ? ? ? Dental ? ?(+) Teeth Intact ?  ?Pulmonary ?neg pulmonary ROS, former smoker,  ?  ?Pulmonary exam normal ? ?+ decreased breath sounds ? ? ? ? ? Cardiovascular ?Exercise Tolerance: Good ?negative cardio ROS ?Normal cardiovascular exam ?Rhythm:Regular Rate:Normal ? ? ?  ?Neuro/Psych ?Anxiety negative neurological ROS ? negative psych ROS  ? GI/Hepatic ?negative GI ROS, Neg liver ROS, GERD  ,  ?Endo/Other  ?negative endocrine ROS ? Renal/GU ?negative Renal ROS  ?negative genitourinary ?  ?Musculoskeletal ?negative musculoskeletal ROS ?(+)  ? Abdominal ?Normal abdominal exam  (+)   ?Peds ?negative pediatric ROS ?(+)  Hematology ?negative hematology ROS ?(+) Blood dyscrasia, anemia ,   ?Anesthesia Other Findings ?Past Medical History: ?No date: Anemia ?    Comment:  reportedly IDA; GIVENS performed by Dr. Elnoria Howard Oct 2009,  ?             no abnormalities ?No date: Anxiety ?No date: Carpal tunnel syndrome ?No date: Dizziness ?No date: GERD (gastroesophageal reflux disease) ?2012: Lupus ([facility]) ?Sept 2009: S/P colonoscopy ?    Comment:  normal colon, large hemorrhoids ?Sept 2009: S/P endoscopy ?    Comment:  Dr. Elnoria Howard: hiatal hernia ?No date: Vertigo ? ?Past Surgical History: ?No date: ADENOIDECTOMY ?No date: bilateral foot surgery ?04/07/2016: CARPAL TUNNEL RELEASE; Right ?    Comment:  Procedure: RIGHT CARPAL TUNNEL RELEASE;  Surgeon: Jillyn Hidden  ?             Merlyn Lot, MD;  Location: [facility];   ?             Service: Orthopedics;  Laterality: Right;  REG/FAB ?No date: CHOLECYSTECTOMY ?2009: endoscopy and colonoscopy ?    Comment:  Dr. Elnoria Howard: hiatal hernia, normal colon, large hemorrhoids ?Oct 2009: GIVENS CAPSULE STUDY ?     Comment:  Dr. Elnoria Howard: normal ?No date: KNEE SURGERY; Right ?No date: NISSEN FUNDOPLICATION ?No date: NOSE SURGERY ?No date: ROTATOR CUFF REPAIR ?No date: TONSILLECTOMY ? ? ? ? Reproductive/Obstetrics ?negative OB ROS ? ?  ? ? ? ? ? ? ? ? ? ? ? ? ? ?  ?  ? ? ? ? ? ? ? ? ?Anesthesia Physical ?Anesthesia Plan ? ?ASA: 2 ? ?Anesthesia Plan: General  ? ?Post-op Pain Management:   ? ?Induction: Intravenous ? ?PONV Risk Score and Plan: Propofol infusion and TIVA ? ?Airway Management Planned: Natural Airway and Nasal Cannula ? ?Additional Equipment:  ? ?Intra-op Plan:  ? ?Post-operative Plan:  ? ?Informed Consent: I have reviewed the patients History and Physical, chart, labs and discussed the procedure including the risks, benefits and alternatives for the proposed anesthesia with the patient or authorized representative who has indicated his/her understanding and acceptance.  ? ? ? ?Dental Advisory Given ? ?Plan Discussed with: Anesthesiologist, CRNA and Surgeon ? ?Anesthesia Plan Comments:   ? ? ? ? ? ? ?Anesthesia Quick Evaluation ? ?

## 2021-05-16 NOTE — Anesthesia Postprocedure Evaluation (Signed)
Anesthesia Post Note ? ?Patient: [name] ? ?Procedure(s) Performed: ESOPHAGOGASTRODUODENOSCOPY (EGD) ? ?Patient location during evaluation: PACU ?Anesthesia Type: General ?Level of consciousness: awake and oriented ?Vital Signs Assessment: post-procedure vital signs reviewed and stable ?Respiratory status: spontaneous breathing and respiratory function stable ?Cardiovascular status: stable ?Anesthetic complications: no ? ? ?No notable events documented. ? ? ?Last Vitals:  ?Vitals:  ? 05/16/21 1200 05/16/21 1202  ?BP: 121/79 132/73  ?Pulse: (!) 57 (!) 58  ?Resp: 14 13  ?Temp:    ?SpO2: 100% 100%  ?  ?Last Pain:  ?Vitals:  ? 05/16/21 1150  ?TempSrc: Temporal  ?PainSc:   ? ? ?  ?  ?  ?  ?  ?  ? ?VAN STAVEREN,Ayanni Tun ? ? ? ? ?

## 2021-05-16 NOTE — Interval H&P Note (Signed)
History and Physical Interval Note: Preprocedure H&P from 05/16/21 ? was reviewed and there was no interval change after seeing and examining the patient.  Written consent was obtained from the patient after discussion of risks, benefits, and alternatives. Patient has consented to proceed with Esophagogastroduodenoscopy with possible intervention ? ? ?05/16/2021 ?11:12 AM ? ?[name]  has presented today for surgery, with the diagnosis of Gastroesophageal reflux disease with hiatal hernia (K21.9,K44.9) ?History of Barrett's esophagus (Z87.19).  The various methods of treatment have been discussed with the patient and family. After consideration of risks, benefits and other options for treatment, the patient has consented to  Procedure(s): ?ESOPHAGOGASTRODUODENOSCOPY (EGD) (N/A) as a surgical intervention.  The patient's history has been reviewed, patient examined, no change in status, stable for surgery.  I have reviewed the patient's chart and labs.  Questions were answered to the patient's satisfaction.   ? ? ?[name] ? ? ?

## 2021-05-16 NOTE — Transfer of Care (Signed)
Immediate Anesthesia Transfer of Care Note ? ?Patient: [name] ? ?Procedure(s) Performed: ESOPHAGOGASTRODUODENOSCOPY (EGD) ? ?Patient Location: PACU ? ?Anesthesia Type:General ? ?Level of Consciousness: drowsy ? ?Airway & Oxygen Therapy: Patient Spontanous Breathing ? ?Post-op Assessment: Report given to RN and Post -op Vital signs reviewed and stable ? ?Post vital signs: Reviewed and stable ? ?Last Vitals:  ?Vitals Value Taken Time  ?BP    ?Temp    ?Pulse    ?Resp    ?SpO2    ? ? ?Last Pain:  ?Vitals:  ? 05/16/21 1026  ?TempSrc: Temporal  ?PainSc: 0-No pain  ?   ? ?  ? ?Complications: No notable events documented. ?

## 2021-05-16 NOTE — Op Note (Addendum)
[facility] ?Gastroenterology ?Patient Name: [name] ?Procedure Date: 05/16/2021 11:20 AM ?MRN: [id number] ?Account #: [id number] ?Date of Birth: 04/25/1955 ?Admit Type: Outpatient ?Age: 66 ?Room: [facility] ENDO ROOM 4 ?Gender: Female ?Note Status: Finalized ?Instrument Name: Upper Endoscope [id number] ?Procedure:             Upper GI endoscopy ?Indications:           Barrett's esophagus ?Providers:             Annamaria Helling DO, DO ?Referring MD:          Precious Bard, MD (Referring MD) ?Medicines:             Monitored Anesthesia Care ?Complications:         No immediate complications. Estimated blood loss:  ?                       Minimal. ?Procedure:             Pre-Anesthesia Assessment: ?                       - Prior to the procedure, a History and Physical was  ?                       performed, and patient medications and allergies were  ?                       reviewed. The patient is competent. The risks and  ?                       benefits of the procedure and the sedation options and  ?                       risks were discussed with the patient. All questions  ?                       were answered and informed consent was obtained.  ?                       Patient identification and proposed procedure were  ?                       verified by the physician, the nurse, the anesthetist  ?                       and the technician in the endoscopy suite. Mental  ?                       Status Examination: alert and oriented. Airway  ?                       Examination: normal oropharyngeal airway and neck  ?                       mobility. Respiratory Examination: clear to  ?                       auscultation. CV Examination: RRR, no murmurs, no S3  ?  or S4. Prophylactic Antibiotics: The patient does not  ?                       require prophylactic antibiotics. Prior  ?                       Anticoagulants: The patient has taken no previous  ?                        anticoagulant or antiplatelet agents. ASA Grade  ?                       Assessment: II - A patient with mild systemic disease.  ?                       After reviewing the risks and benefits, the patient  ?                       was deemed in satisfactory condition to undergo the  ?                       procedure. The anesthesia plan was to use monitored  ?                       anesthesia care (MAC). Immediately prior to  ?                       administration of medications, the patient was  ?                       re-assessed for adequacy to receive sedatives. The  ?                       heart rate, respiratory rate, oxygen saturations,  ?                       blood pressure, adequacy of pulmonary ventilation, and  ?                       response to care were monitored throughout the  ?                       procedure. The physical status of the patient was  ?                       re-assessed after the procedure. ?                       After obtaining informed consent, the endoscope was  ?                       passed under direct vision. Throughout the procedure,  ?                       the patient's blood pressure, pulse, and oxygen  ?                       saturations were monitored continuously. The Endoscope  ?  was introduced through the mouth, and advanced to the  ?                       second part of duodenum. The upper GI endoscopy was  ?                       accomplished without difficulty. The patient tolerated  ?                       the procedure well. ?Findings: ?     The duodenal bulb, first portion of the duodenum and second portion of  ?     the duodenum were normal. Estimated blood loss: none. ?     Localized mild inflammation characterized by erythema was found in the  ?     gastric antrum. Biopsies were taken with a cold forceps for Helicobacter  ?     pylori testing. Estimated blood loss was minimal. Pylorus narrowed, but  ?     patent. Easily  able to traverse with scope. Pt does not note symptoms of  ?     nausea or vomiting. ?     A medium-sized hiatal hernia was present. Estimated blood loss: none.  ?     Previous nissen wrap noted. Estimated blood loss: none. ?     The Z-line was variable. Biopsies were taken with a cold forceps for  ?     histology. Estimated blood loss was minimal. ?     The exam of the esophagus was otherwise normal. ?Impression:            - Normal duodenal bulb, first portion of the duodenum  ?                       and second portion of the duodenum. ?                       - Gastritis. Biopsied. ?                       - Medium-sized hiatal hernia. ?                       - Z-line variable. Biopsied. ?Recommendation:        - Discharge patient to home. ?                       - Resume previous diet. ?                       - Continue present medications. ?                       - Await pathology results. ?                       - Return to GI clinic as previously scheduled. ?                       - The findings and recommendations were discussed with  ?                       the patient. ?Procedure Code(s):     --- Professional --- ?  44695, Esophagogastroduodenoscopy, flexible,  ?                       transoral; with biopsy, single or multiple ?Diagnosis Code(s):     --- Professional --- ?                       K22.70, Barrett's esophagus without dysplasia ?                       K29.70, Gastritis, unspecified, without bleeding ?                       K44.9, Diaphragmatic hernia without obstruction or  ?                       gangrene ?                       K22.8, Other specified diseases of esophagus ?CPT copyright 2019 [employer]. All rights reserved. ?The codes documented in this report are preliminary and upon coder review may  ?be revised to meet current compliance requirements. ?Attending Participation: ?     I personally performed the entire procedure. ?Volney American, DO ?Annamaria Helling DO, DO ?05/16/2021 11:41:32 AM ?This report has been signed electronically. ?Number of Addenda: 0 ?Note Initiated On: 05/16/2021 11:20 AM ?Estimated Blood Loss:  Estimated blood loss was minimal. ?     [facility] ?

## 2021-05-16 NOTE — Anesthesia Procedure Notes (Signed)
Procedure Name: [facility] ?Date/Time: 05/16/2021 11:13 AM ?Performed by: Tollie Eth, CRNA ?Pre-anesthesia Checklist: Patient identified, Emergency Drugs available, Suction available and Patient being monitored ?Patient Re-evaluated:Patient Re-evaluated prior to induction ?Oxygen Delivery Method: Nasal cannula ?Induction Type: IV induction ?Placement Confirmation: positive ETCO2 ? ? ? ? ?

## 2021-05-19 ENCOUNTER — Encounter: Payer: Self-pay | Admitting: Gastroenterology

## 2021-05-19 LAB — SURGICAL PATHOLOGY

## 2021-05-25 ENCOUNTER — Encounter (INDEPENDENT_AMBULATORY_CARE_PROVIDER_SITE_OTHER): Payer: Self-pay | Admitting: Nurse Practitioner

## 2021-05-25 NOTE — Progress Notes (Signed)
? ?Subjective:  ? ? Patient ID: [name], female    DOB: November 17, 1955, 66 y.o.   MRN: [id number] ?Chief Complaint  ?Patient presents with  ? Establish Care  ?  Referred by Jeanie CooksMiriam Mcglaughin  ? ? ?The patient is seen for evaluation of subclavian steal syndrome after was detected by a CT scan.  She denies any significant ischemic leg pain symptoms.  There are no changes associated with activity.  No history of ulcerations or embolic events. ? ?The patient endoeses vertigo or dizziness.  No changes with looking upward. ? ?The patient denies rest pain or dangling of the arm off the side of the bed during the night for relief. ?No open wounds or sores of the arms at this time. ?No prior vascular interventions or surgeries. ? ?No history of neck problems or DJD of the cervical spine.  ? ?The patient denies claudication symptoms or rest pain symptoms of the lower extremities.  No new ulcers or wounds of the foot. ? ?The patient's blood pressure has been stable and relatively well controlled. ?No documented history of amaurosis fugax or recent TIA symptoms. There are no recent neurological changes noted. ?No documented history of DVT, PE or superficial thrombophlebitis. ?No recent episodes of angina or shortness of breath.  ?  ?CT scan on 04/15/2021 shows left subclavian steal syndrome. ? ? ?Review of Systems  ?All other systems reviewed and are negative. ? ?   ?Objective:  ? Physical Exam ?Vitals reviewed.  ?HENT:  ?   Head: Normocephalic.  ?Cardiovascular:  ?   Rate and Rhythm: Normal rate.  ?   Pulses:     ?     Radial pulses are 1+ on the right side and 1+ on the left side.  ?Pulmonary:  ?   Effort: Pulmonary effort is normal.  ?Skin: ?   General: Skin is warm and dry.  ?Neurological:  ?   Mental Status: She is alert and oriented to person, place, and time.  ?Psychiatric:     ?   Mood and Affect: Mood normal.     ?   Behavior: Behavior normal.     ?   Thought Content: Thought content normal.     ?   Judgment:  Judgment normal.  ? ? ?BP 126/90 (BP Location: Left Arm)   Pulse 81   Resp 17   Ht 5\' 4"  (1.626 m)   Wt 154 lb (69.9 kg)   BMI 26.43 kg/m?  ? ?Past Medical History:  ?Diagnosis Date  ? Anemia   ? reportedly IDA; GIVENS performed by Dr. Elnoria HowardHung Oct 2009, no abnormalities  ? Anxiety   ? Carpal tunnel syndrome   ? Dizziness   ? GERD (gastroesophageal reflux disease)   ? Lupus (HCC) 2012  ? S/P colonoscopy Sept 2009  ? normal colon, large hemorrhoids  ? S/P endoscopy Sept 2009  ? Dr. Elnoria HowardHung: hiatal hernia  ? Vertigo   ? ? ?Social History  ? ?Socioeconomic History  ? Marital status: Widowed  ?  Spouse name: Not on file  ? Number of children: 0  ? Years of education: 7614  ? Highest education level: Not on file  ?Occupational History  ? Occupation: Retired  ?  Employer: [employer]  ?Tobacco Use  ? Smoking status: Former  ?  Types: Cigarettes  ?  Quit date: 08/15/1998  ?  Years since quitting: 22.7  ? Smokeless tobacco: Never  ? Tobacco comments:  ?  Quit 2000  ?  Vaping Use  ? Vaping Use: Never used  ?Substance and Sexual Activity  ? Alcohol use: Not Currently  ?  Alcohol/week: 0.0 standard drinks  ?  Comment: occ  ? Drug use: No  ? Sexual activity: Not on file  ?Other Topics Concern  ? Not on file  ?Social History Narrative  ? Lives at home with husband.  ? Right-handed.  ? No caffeine use.  ? ?Social Determinants of Health  ? ?Financial Resource Strain: Not on file  ?Food Insecurity: Not on file  ?Transportation Needs: Not on file  ?Physical Activity: Not on file  ?Stress: Not on file  ?Social Connections: Not on file  ?Intimate Partner Violence: Not on file  ? ? ?Past Surgical History:  ?Procedure Laterality Date  ? ADENOIDECTOMY    ? bilateral foot surgery    ? CARPAL TUNNEL RELEASE Right 04/07/2016  ? Procedure: RIGHT CARPAL TUNNEL RELEASE;  Surgeon: Cindee Salt, MD;  Location: [facility];  Service: Orthopedics;  Laterality: Right;  REG/FAB  ? CHOLECYSTECTOMY    ? endoscopy and colonoscopy  2009  ? Dr.  Elnoria Howard: hiatal hernia, normal colon, large hemorrhoids  ? ESOPHAGOGASTRODUODENOSCOPY N/A 05/16/2021  ? Procedure: ESOPHAGOGASTRODUODENOSCOPY (EGD);  Surgeon: [name], DO;  Location: [facility] ENDOSCOPY;  Service: Gastroenterology;  Laterality: N/A;  ? GIVENS CAPSULE STUDY  Oct 2009  ? Dr. Elnoria Howard: normal  ? KNEE SURGERY Right   ? NISSEN FUNDOPLICATION    ? NOSE SURGERY    ? ROTATOR CUFF REPAIR    ? TONSILLECTOMY    ? ? ?Family History  ?Problem Relation Age of Onset  ? Breast cancer Sister   ? Anxiety disorder Mother   ? Diabetes Father   ? Cervical cancer Maternal Grandmother   ? Stroke Maternal Grandfather   ? Heart attack Paternal Grandfather   ? Colon cancer Neg Hx   ? ? ?Allergies  ?Allergen Reactions  ? Bismuth Subsalicylate Nausea And Vomiting  ? Cephalexin Other (See Comments)  ?  Severe sore throat   ? Codeine Nausea And Vomiting  ? Famotidine Nausea And Vomiting  ? ? ? ?  Latest Ref Rng & Units 02/11/2011  ?  4:01 PM 02/12/2009  ?  7:24 AM 02/15/2008  ? 12:15 PM  ?CBC  ?WBC 4.0 - 10.5 K/uL 6.9    6.6    ?Hemoglobin 12.0 - 15.0 g/dL 70.3   50.0   93.8    ?Hematocrit 36.0 - 46.0 % 38.5    40.3    ?Platelets 150 - 400 K/uL 256    243    ? ? ? ? ?CMP  ?   ?Component Value Date/Time  ? NA 140 01/30/2014 0000  ? K 4.1 01/30/2014 0000  ? CL 105 01/30/2014 0000  ? CO2 26 01/30/2014 0000  ? GLUCOSE 85 01/30/2014 0000  ? BUN 15 01/30/2014 0000  ? CREATININE 0.63 01/30/2014 0000  ? CALCIUM 9.0 01/30/2014 0000  ? PROT 7.0 01/30/2014 0000  ? ALBUMIN 4.2 01/30/2014 0000  ? AST 42 (H) 01/30/2014 0000  ? ALT 59 (H) 01/30/2014 0000  ? ALKPHOS 82 01/30/2014 0000  ? BILITOT 0.3 01/30/2014 0000  ? GFRNONAA >89 01/30/2014 0000  ? GFRAA >89 01/30/2014 0000  ? ? ? ?No results found. ? ?   ?Assessment & Plan:  ? ?1. Subclavian artery stenosis (HCC) ?Currently the patient has other medical issues that are being worked up and treated.  We will plan on having the patient  return in early July for follow-up reevaluation of her  subclavian stenosis.  In the interim she does not have any ischemic or limb threatening symptoms.   ? ? ?Current Outpatient Medications on File Prior to Visit  ?Medication Sig Dispense Refill  ? alendronate (FOSAMAX) 70 MG tablet Take 70 mg by mouth once a week.    ? amphetamine-dextroamphetamine (ADDERALL) 10 MG tablet Take 10 mg by mouth daily.    ? aspirin-acetaminophen-caffeine (EXCEDRIN MIGRAINE) 250-250-65 MG tablet Take 1 tablet by mouth every 6 (six) hours as needed for headache.    ? Biotin 10 MG CAPS Take by mouth.    ? cyclobenzaprine (FLEXERIL) 10 MG tablet Cyclobenzaprine    ? diclofenac (VOLTAREN) 75 MG EC tablet Take 75 mg by mouth as needed.     ? escitalopram (LEXAPRO) 20 MG tablet Take 20 mg by mouth daily.    ? esomeprazole (NEXIUM) 40 MG capsule Take by mouth.    ? ferrous sulfate 325 (65 FE) MG tablet Take 325 mg by mouth daily with breakfast.    ? hydroxychloroquine (PLAQUENIL) 200 MG tablet Take by mouth.    ? Simethicone 125 MG CAPS Simethicone    ? vitamin B-12 (CYANOCOBALAMIN) 500 MCG tablet Take 500 mcg by mouth daily.    ? ?No current facility-administered medications on file prior to visit.  ? ? ?There are no Patient Instructions on file for this visit. ?No follow-ups on file. ? ? ?Georgiana Spinner, NP ? ? ?

## 2021-08-01 ENCOUNTER — Other Ambulatory Visit (INDEPENDENT_AMBULATORY_CARE_PROVIDER_SITE_OTHER): Payer: Self-pay | Admitting: Nurse Practitioner

## 2021-08-01 DIAGNOSIS — I771 Stricture of artery: Secondary | ICD-10-CM

## 2021-08-07 ENCOUNTER — Ambulatory Visit (INDEPENDENT_AMBULATORY_CARE_PROVIDER_SITE_OTHER): Payer: Medicare Other

## 2021-08-07 ENCOUNTER — Telehealth (INDEPENDENT_AMBULATORY_CARE_PROVIDER_SITE_OTHER): Payer: Self-pay

## 2021-08-07 ENCOUNTER — Ambulatory Visit (INDEPENDENT_AMBULATORY_CARE_PROVIDER_SITE_OTHER): Payer: Medicare Other | Admitting: Nurse Practitioner

## 2021-08-07 ENCOUNTER — Encounter (INDEPENDENT_AMBULATORY_CARE_PROVIDER_SITE_OTHER): Payer: Self-pay | Admitting: Nurse Practitioner

## 2021-08-07 VITALS — BP 119/75 | HR 83 | Resp 16 | Ht 64.0 in | Wt 147.0 lb

## 2021-08-07 DIAGNOSIS — I771 Stricture of artery: Secondary | ICD-10-CM

## 2021-08-07 DIAGNOSIS — E782 Mixed hyperlipidemia: Secondary | ICD-10-CM

## 2021-08-07 DIAGNOSIS — R140 Abdominal distension (gaseous): Secondary | ICD-10-CM | POA: Insufficient documentation

## 2021-08-07 NOTE — Telephone Encounter (Signed)
I attempted to contact the patient to schedule her for a LUE angio and a message was left for a return call.

## 2021-08-08 NOTE — Telephone Encounter (Addendum)
I attempted to contact the patient to scheduled a LUE angio. A message was left for a return call. Patient called back and is scheduled with Dr. Wyn Quaker on 08/18/21 with a 8:15 am arrival time to the MM. Pre-procedure instructions were discussed and will be mailed. Patient was offered 08/11/21 and declined.

## 2021-08-17 ENCOUNTER — Encounter (INDEPENDENT_AMBULATORY_CARE_PROVIDER_SITE_OTHER): Payer: Self-pay | Admitting: Nurse Practitioner

## 2021-08-17 NOTE — Progress Notes (Signed)
Subjective:    Patient ID: [name], female    DOB: 07-31-1955, 66 y.o.   MRN: [id number] Chief Complaint  Patient presents with   Follow-up    ultrasound    The patient returns today for for evaluation of subclavian steal syndrome after was detected by a CT scan.  The patient has other medical issues which are being addressed and she is following up in regards to her known subclavian steal symptoms.  She denies any significant ischemic leg pain symptoms.  There are no changes associated with activity.  No history of ulcerations or embolic events.   The patient endoeses vertigo or dizziness.  No changes with looking upward.   The patient denies rest pain or dangling of the arm off the side of the bed during the night for relief. No open wounds or sores of the arms at this time. No prior vascular interventions or surgeries.   No history of neck problems or DJD of the cervical spine.    The patient denies claudication symptoms or rest pain symptoms of the lower extremities.  No new ulcers or wounds of the foot.   The patient's blood pressure has been stable and relatively well controlled. No documented history of amaurosis fugax or recent TIA symptoms. There are no recent neurological changes noted. No documented history of DVT, PE or superficial thrombophlebitis. No recent episodes of angina or shortness of breath.    CT scan on 04/15/2021 shows left subclavian steal syndrome.    Review of Systems  Neurological:  Positive for dizziness.  All other systems reviewed and are negative.      Objective:   Physical Exam Vitals reviewed.  HENT:     Head: Normocephalic.  Cardiovascular:     Rate and Rhythm: Normal rate.     Pulses:          Radial pulses are 2+ on the right side and 1+ on the left side.  Pulmonary:     Effort: Pulmonary effort is normal.  Skin:    General: Skin is warm and dry.  Neurological:     Mental Status: She is alert and oriented to person,  place, and time.  Psychiatric:        Mood and Affect: Mood normal.        Behavior: Behavior normal.        Thought Content: Thought content normal.        Judgment: Judgment normal.     BP 119/75 (BP Location: Right Arm)   Pulse 83   Resp 16   Ht 5\' 4"  (1.626 m)   Wt 147 lb (66.7 kg)   BMI 25.23 kg/m   Past Medical History:  Diagnosis Date   Anemia    reportedly IDA; GIVENS performed by Dr. Oct 2009, no abnormalities   Anxiety    Carpal tunnel syndrome    Dizziness    GERD (gastroesophageal reflux disease)    Lupus (HCC) 2012   S/P colonoscopy Sept 2009   normal colon, large hemorrhoids   S/P endoscopy Sept 2009   Dr. 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   Right-handed.   No caffeine use.   Social Determinants of Health   Financial Resource Strain: Not on file  Food Insecurity: Not on file  Transportation Needs: Not on file  Physical Activity: Not on file  Stress: Not on file  Social Connections: Not on file  Intimate Partner Violence: Not on file    Past Surgical History:  Procedure Laterality Date   ADENOIDECTOMY     bilateral foot surgery     CARPAL TUNNEL RELEASE Right 04/07/2016   Procedure: RIGHT CARPAL TUNNEL  RELEASE;  Surgeon: Cindee Salt, MD;  Location: [facility];  Service: Orthopedics;  Laterality: Right;  REG/FAB   CHOLECYSTECTOMY     endoscopy and colonoscopy  2009   Dr. Elnoria Howard: hiatal hernia, normal colon, large hemorrhoids   ESOPHAGOGASTRODUODENOSCOPY N/A 05/16/2021   Procedure: ESOPHAGOGASTRODUODENOSCOPY (EGD);  Surgeon: [name], DO;  Location: [facility] END[facility]COPY;  Service: Gastroenterology;  Laterality: N/A;   GIVENS CAPSULE STUDY  Oct 2009   Dr. Elnoria Howard: normal   KNEE SURGERY Right    NISSEN FUNDOPLICATION     NOSE SURGERY     ROTATOR CUFF REPAIR     TONSILLECTOMY      Family History  Problem Relation Age of Onset   Breast cancer Sister    Anxiety disorder Mother    Diabetes Father    Cervical cancer Maternal Grandmother    Stroke Maternal Grandfather    Heart attack Paternal Grandfather    Colon cancer Neg Hx     Allergies  Allergen Reactions   Bismuth Subsalicylate Nausea And Vomiting   Cephalexin Other (See Comments)    Severe sore throat    Codeine Nausea And Vomiting   Famotidine Nausea And Vomiting       Latest Ref Rng & Units 02/11/2011    4:01 PM 02/12/2009    7:24 AM 02/15/2008   12:15 PM  CBC  WBC 4.0 - 10.5 K/uL 6.9   6.6   Hemoglobin 12.0 - 15.0 g/dL 69.4  50.3  88.8   Hematocrit 36.0 - 46.0 % 38.5   40.3   Platelets 150 - 400 K/uL 256   243       CMP     Component Value Date/Time   NA 140 01/30/2014 0000   K 4.1 01/30/2014 0000   CL 105 01/30/2014 0000   CO2 26 01/30/2014 0000   GLUCOSE 85 01/30/2014 0000   BUN 15 01/30/2014 0000   CREATININE 0.63 01/30/2014 0000   CALCIUM 9.0 01/30/2014 0000   PROT 7.0 01/30/2014 0000   ALBUMIN 4.2 01/30/2014 0000   AST 42 (H) 01/30/2014 0000   ALT 59 (H) 01/30/2014 0000   ALKPHOS 82 01/30/2014 0000   BILITOT 0.3 01/30/2014 0000   GFRNONAA >89 01/30/2014 0000   GFRAA >89 01/30/2014 0000     No results found.     Assessment & Plan:  1. Stenosis of left subclavian artery  (HCC) The patient has symptomatic evidence of left subclavian steal.  Based on this the patient should undergo a left upper extremity angiogram with possible intervention.  We discussed the risk, benefits and alternatives.  Patient is agreeable to proceed. - VAS US CAROTID  2. Mixed hyperlipidemia Continue statin as ordered and reviewed, no changes at this time    Current Outpatient Medications on File Prior to Visit  Medication Sig Dispense Refill   alendronate (FOSAMAX) 70 MG tablet Take 70 mg by mouth once a week.     amphetamine-dextroamphetamine (ADDERALL) 10 MG tablet Take 10 mg by mouth daily.     aspirin-acetaminophen-caffeine (EXCEDRIN MIGRAINE) 250-250-65 MG tablet Take 1 tablet by mouth every 6 (six) hours as needed for headache.     cyclobenzaprine (FLEXERIL) 10 MG tablet Cyclobenzaprine     diclofenac (VOLTAREN) 75 MG EC tablet Take 75 mg by mouth as needed.      escitalopram (LEXAPRO) 20 MG tablet Take 20 mg by mouth daily.     esomeprazole (NEXIUM) 40 MG capsule Take by mouth.     ferrous sulfate 325 (65 FE) MG tablet Take 325 mg by mouth daily with breakfast.     Simethicone 125 MG CAPS Simethicone     vitamin B-12 (CYANOCOBALAMIN) 500 MCG tablet Take 500 mcg by mouth daily.     Biotin 10 MG CAPS Take by mouth. (Patient not taking: Reported on 08/07/2021)     hydroxychloroquine (PLAQUENIL) 200 MG tablet Take by mouth. (Patient not taking: Reported on 08/07/2021)     No current facility-administered medications on file prior to visit.    There are no Patient Instructions on file for this visit. No follow-ups on file.   Georgiana Spinner, NP

## 2021-08-17 NOTE — H&P (View-Only) (Signed)
Subjective:    Patient ID: [name], female    DOB: 07-31-1955, 66 y.o.   MRN: [id number] Chief Complaint  Patient presents with   Follow-up    ultrasound    The patient returns today for for evaluation of subclavian steal syndrome after was detected by a CT scan.  The patient has other medical issues which are being addressed and she is following up in regards to her known subclavian steal symptoms.  She denies any significant ischemic leg pain symptoms.  There are no changes associated with activity.  No history of ulcerations or embolic events.   The patient endoeses vertigo or dizziness.  No changes with looking upward.   The patient denies rest pain or dangling of the arm off the side of the bed during the night for relief. No open wounds or sores of the arms at this time. No prior vascular interventions or surgeries.   No history of neck problems or DJD of the cervical spine.    The patient denies claudication symptoms or rest pain symptoms of the lower extremities.  No new ulcers or wounds of the foot.   The patient's blood pressure has been stable and relatively well controlled. No documented history of amaurosis fugax or recent TIA symptoms. There are no recent neurological changes noted. No documented history of DVT, PE or superficial thrombophlebitis. No recent episodes of angina or shortness of breath.    CT scan on 04/15/2021 shows left subclavian steal syndrome.    Review of Systems  Neurological:  Positive for dizziness.  All other systems reviewed and are negative.      Objective:   Physical Exam Vitals reviewed.  HENT:     Head: Normocephalic.  Cardiovascular:     Rate and Rhythm: Normal rate.     Pulses:          Radial pulses are 2+ on the right side and 1+ on the left side.  Pulmonary:     Effort: Pulmonary effort is normal.  Skin:    General: Skin is warm and dry.  Neurological:     Mental Status: She is alert and oriented to person,  place, and time.  Psychiatric:        Mood and Affect: Mood normal.        Behavior: Behavior normal.        Thought Content: Thought content normal.        Judgment: Judgment normal.     BP 119/75 (BP Location: Right Arm)   Pulse 83   Resp 16   Ht 5\' 4"  (1.626 m)   Wt 147 lb (66.7 kg)   BMI 25.23 kg/m   Past Medical History:  Diagnosis Date   Anemia    reportedly IDA; GIVENS performed by Dr. Oct 2009, no abnormalities   Anxiety    Carpal tunnel syndrome    Dizziness    GERD (gastroesophageal reflux disease)    Lupus (HCC) 2012   S/P colonoscopy Sept 2009   normal colon, large hemorrhoids   S/P endoscopy Sept 2009   Dr. 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   Right-handed.   No caffeine use.   Social Determinants of Health   Financial Resource Strain: Not on file  Food Insecurity: Not on file  Transportation Needs: Not on file  Physical Activity: Not on file  Stress: Not on file  Social Connections: Not on file  Intimate Partner Violence: Not on file    Past Surgical History:  Procedure Laterality Date   ADENOIDECTOMY     bilateral foot surgery     CARPAL TUNNEL RELEASE Right 04/07/2016   Procedure: RIGHT CARPAL TUNNEL  RELEASE;  Surgeon: Gary Kuzma, MD;  Location: [facility];  Service: Orthopedics;  Laterality: Right;  REG/FAB   CHOLECYSTECTOMY     endoscopy and colonoscopy  2009   Dr. Hung: hiatal hernia, normal colon, large hemorrhoids   ESOPHAGOGASTRODUODENOSCOPY N/A 05/16/2021   Procedure: ESOPHAGOGASTRODUODENOSCOPY (EGD);  Surgeon: Russo, Steven Michael, DO;  Location: [facility] ENDOSCOPY;  Service: Gastroenterology;  Laterality: N/A;   GIVENS CAPSULE STUDY  Oct 2009   Dr. Hung: normal   KNEE SURGERY Right    NISSEN FUNDOPLICATION     NOSE SURGERY     ROTATOR CUFF REPAIR     TONSILLECTOMY      Family History  Problem Relation Age of Onset   Breast cancer Sister    Anxiety disorder Mother    Diabetes Father    Cervical cancer Maternal Grandmother    Stroke Maternal Grandfather    Heart attack Paternal Grandfather    Colon cancer Neg Hx     Allergies  Allergen Reactions   Bismuth Subsalicylate Nausea And Vomiting   Cephalexin Other (See Comments)    Severe sore throat    Codeine Nausea And Vomiting   Famotidine Nausea And Vomiting       Latest Ref Rng & Units 02/11/2011    4:01 PM 02/12/2009    7:24 AM 02/15/2008   12:15 PM  CBC  WBC 4.0 - 10.5 K/uL 6.9   6.6   Hemoglobin 12.0 - 15.0 g/dL 12.4  13.3  13.4   Hematocrit 36.0 - 46.0 % 38.5   40.3   Platelets 150 - 400 K/uL 256   243       CMP     Component Value Date/Time   NA 140 01/30/2014 0000   K 4.1 01/30/2014 0000   CL 105 01/30/2014 0000   CO2 26 01/30/2014 0000   GLUCOSE 85 01/30/2014 0000   BUN 15 01/30/2014 0000   CREATININE 0.63 01/30/2014 0000   CALCIUM 9.0 01/30/2014 0000   PROT 7.0 01/30/2014 0000   ALBUMIN 4.2 01/30/2014 0000   AST 42 (H) 01/30/2014 0000   ALT 59 (H) 01/30/2014 0000   ALKPHOS 82 01/30/2014 0000   BILITOT 0.3 01/30/2014 0000   GFRNONAA >89 01/30/2014 0000   GFRAA >89 01/30/2014 0000     No results found.     Assessment & Plan:     1. Stenosis of left subclavian artery  (HCC) The patient has symptomatic evidence of left subclavian steal.  Based on this the patient should undergo a left upper extremity angiogram with possible intervention.  We discussed the risk, benefits and alternatives.  Patient is agreeable to proceed. - VAS US CAROTID  2. Mixed hyperlipidemia Continue statin as ordered and reviewed, no changes at this time    Current Outpatient Medications on File Prior to Visit  Medication Sig Dispense Refill   alendronate (FOSAMAX) 70 MG tablet Take 70 mg by mouth once a week.     amphetamine-dextroamphetamine (ADDERALL) 10 MG tablet Take 10 mg by mouth daily.     aspirin-acetaminophen-caffeine (EXCEDRIN MIGRAINE) 250-250-65 MG tablet Take 1 tablet by mouth every 6 (six) hours as needed for headache.     cyclobenzaprine (FLEXERIL) 10 MG tablet Cyclobenzaprine     diclofenac (VOLTAREN) 75 MG EC tablet Take 75 mg by mouth as needed.      escitalopram (LEXAPRO) 20 MG tablet Take 20 mg by mouth daily.     esomeprazole (NEXIUM) 40 MG capsule Take by mouth.     ferrous sulfate 325 (65 FE) MG tablet Take 325 mg by mouth daily with breakfast.     Simethicone 125 MG CAPS Simethicone     vitamin B-12 (CYANOCOBALAMIN) 500 MCG tablet Take 500 mcg by mouth daily.     Biotin 10 MG CAPS Take by mouth. (Patient not taking: Reported on 08/07/2021)     hydroxychloroquine (PLAQUENIL) 200 MG tablet Take by mouth. (Patient not taking: Reported on 08/07/2021)     No current facility-administered medications on file prior to visit.    There are no Patient Instructions on file for this visit. No follow-ups on file.   Georgiana Spinner, NP

## 2021-08-18 ENCOUNTER — Ambulatory Visit
Admission: RE | Admit: 2021-08-18 | Discharge: 2021-08-18 | Disposition: A | Discharge status: Home / Self Care | Payer: Medicare Other | Attending: Vascular Surgery | Admitting: Vascular Surgery

## 2021-08-18 ENCOUNTER — Encounter: Payer: Self-pay | Admitting: Vascular Surgery

## 2021-08-18 ENCOUNTER — Other Ambulatory Visit: Payer: Self-pay

## 2021-08-18 ENCOUNTER — Encounter
Admission: RE | Disposition: A | Discharge status: Home / Self Care | Payer: Self-pay | Source: Home / Self Care | Attending: Vascular Surgery

## 2021-08-18 DIAGNOSIS — G458 Other transient cerebral ischemic attacks and related syndromes: Secondary | ICD-10-CM | POA: Diagnosis present

## 2021-08-18 DIAGNOSIS — Q2549 Other congenital malformations of aorta: Secondary | ICD-10-CM

## 2021-08-18 DIAGNOSIS — E782 Mixed hyperlipidemia: Secondary | ICD-10-CM | POA: Diagnosis not present

## 2021-08-18 HISTORY — PX: UPPER EXTREMITY ANGIOGRAPHY: CATH118270

## 2021-08-18 LAB — BUN: BUN: 12 mg/dL (ref 8–23)

## 2021-08-18 LAB — CREATININE, SERUM
Creatinine, Ser: 0.7 mg/dL (ref 0.44–1.00)
GFR, Estimated: >60 mL/min (ref >60)

## 2021-08-18 SURGERY — UPPER EXTREMITY ANGIOGRAPHY
Anesthesia: Moderate Sedation | Laterality: Left

## 2021-08-18 MED ORDER — SODIUM CHLORIDE 0.9 % IV SOLN: INTRAVENOUS | Status: DC

## 2021-08-18 MED ORDER — MIDAZOLAM HCL 2 MG/ML PO SYRP: 8.0000 mg | ORAL_SOLUTION | Freq: Once | ORAL | Status: DC | PRN

## 2021-08-18 MED ORDER — ASPIRIN 81 MG PO TBEC: 81.0000 mg | DELAYED_RELEASE_TABLET | Freq: Every day | ORAL | 2 refills | Status: DC

## 2021-08-18 MED ORDER — FENTANYL CITRATE (PF) 100 MCG/2ML IJ SOLN
INTRAMUSCULAR | Status: DC | PRN
  Administered 2021-08-18: 50 ug via INTRAVENOUS

## 2021-08-18 MED ORDER — METHYLPREDNISOLONE SODIUM SUCC 125 MG IJ SOLR: 125.0000 mg | Freq: Once | INTRAMUSCULAR | Status: DC | PRN

## 2021-08-18 MED ORDER — FENTANYL CITRATE (PF) 100 MCG/2ML IJ SOLN: 12.5000 ug | Freq: Once | INTRAMUSCULAR | Status: DC | PRN

## 2021-08-18 MED ORDER — ATORVASTATIN CALCIUM 10 MG PO TABS: 10.0000 mg | ORAL_TABLET | Freq: Every day | ORAL | 11 refills | Status: DC

## 2021-08-18 MED ORDER — VANCOMYCIN HCL IN DEXTROSE 1-5 GM/200ML-% IV SOLN
1000.0000 mg | INTRAVENOUS | Status: AC
  Administered 2021-08-18: 1000 mg via INTRAVENOUS
  Filled 2021-08-18: qty 200

## 2021-08-18 MED ORDER — MIDAZOLAM HCL 2 MG/2ML IJ SOLN
INTRAMUSCULAR | Status: DC | PRN
  Administered 2021-08-18: 2 mg via INTRAVENOUS

## 2021-08-18 MED ORDER — HEPARIN SODIUM (PORCINE) 1000 UNIT/ML IJ SOLN
INTRAMUSCULAR | Status: DC | PRN
  Administered 2021-08-18: 5000 [IU] via INTRAVENOUS

## 2021-08-18 MED ORDER — ONDANSETRON HCL 4 MG/2ML IJ SOLN: 4.0000 mg | Freq: Four times a day (QID) | INTRAMUSCULAR | Status: DC | PRN

## 2021-08-18 MED ORDER — FENTANYL CITRATE (PF) 100 MCG/2ML IJ SOLN
INTRAMUSCULAR | Status: AC
  Filled 2021-08-18: qty 2

## 2021-08-18 MED ORDER — HYDROMORPHONE HCL 1 MG/ML IJ SOLN: 1.0000 mg | Freq: Once | INTRAMUSCULAR | Status: DC | PRN

## 2021-08-18 MED ORDER — MIDAZOLAM HCL 2 MG/2ML IJ SOLN
INTRAMUSCULAR | Status: AC
  Filled 2021-08-18: qty 4

## 2021-08-18 MED ORDER — FAMOTIDINE 20 MG PO TABS: 40.0000 mg | ORAL_TABLET | Freq: Once | ORAL | Status: DC | PRN

## 2021-08-18 MED ORDER — IODIXANOL 320 MG/ML IV SOLN
INTRAVENOUS | Status: DC | PRN
  Administered 2021-08-18: 50 mL

## 2021-08-18 MED ORDER — CLOPIDOGREL BISULFATE 75 MG PO TABS
75.0000 mg | ORAL_TABLET | Freq: Every day | ORAL | 11 refills | Status: DC
Start: 2021-08-18 — End: 2021-09-19

## 2021-08-18 SURGICAL SUPPLY — 14 items
CATH ANGIO 5F PIGTAIL 100CM (CATHETERS) ×1 IMPLANT
CATH BEACON 5 .035 100 H1 TIP (CATHETERS) ×1 IMPLANT
COVER PROBE U/S 5X48 (MISCELLANEOUS) ×1 IMPLANT
DEVICE STARCLOSE SE CLOSURE (Vascular Products) ×1 IMPLANT
GLIDEWIRE ADV .035X260CM (WIRE) ×1 IMPLANT
GLIDEWIRE ANGLED SS 035X260CM (WIRE) ×1 IMPLANT
KIT ENCORE 26 ADVANTAGE (KITS) ×1 IMPLANT
PACK ANGIOGRAPHY (CUSTOM PROCEDURE TRAY) ×1 IMPLANT
SHEATH BRITE TIP 5FRX11 (SHEATH) ×1 IMPLANT
SHEATH RAABE 6FRX70 (SHEATH) ×1 IMPLANT
STENT LIFESTREAM 7X26X80 (Permanent Stent) ×1 IMPLANT
SYR MEDRAD MARK 7 150ML (SYRINGE) ×1 IMPLANT
TUBING CONTRAST HIGH PRESS 72 (TUBING) ×1 IMPLANT
WIRE GUIDERIGHT .035X150 (WIRE) ×1 IMPLANT

## 2021-08-18 NOTE — Op Note (Signed)
OPERATIVE REPORT     PREOPERATIVE DIAGNOSIS: 1.  Left subclavian steal syndrome   POSTOPERATIVE DIAGNOSIS: Same as above   PROCEDURE PERFORMED: 1. Ultrasound guidance vascular access to right femoral artery. 2. Catheter placement to left brachial artery     from right femoral approach. 3. Thoracic aortogram and selective left upper extremity angiogram 4.  Balloon expandable stent placement to the proximal left subclavian artery with 7 mm diameter by 26 mm length lifestream stent 5. StarClose closure device right femoral artery.   SURGEON:  Algernon Huxley, MD   ANESTHESIA:  Local with moderate conscious sedation for 29 minutes using 2 mg of Versed and 50 mcg of Fentanyl   BLOOD LOSS:  Minimal.   FLUOROSCOPY TIME: 2.9 minutes   INDICATION FOR PROCEDURE:  This is a 66 y.o.female who presented to our office with left subclavian stenosis with subclavian steal syndrome.  To further evaluate this to determine what options would be possible to treat the steal syndrome, angiogram of the left upper extremity is indicated.  Risks and benefits are discussed.  Informed consent was obtained.   DESCRIPTION OF PROCEDURE:  The patient was brought to the vascular suite.  Moderate conscious sedation was administered during a face to face encounter with the patient throughout the procedure with my supervision of the RN administering medicines and monitoring the patient's vital signs, pulse oximetry, telemetry and mental status throughout from the start of the procedure until the patient was taken to the recovery room.  Groins were shaved and prepped and sterile surgical field was created.  The right femoral head was localized with fluoroscopy and the right femoral artery was then visualized with ultrasound and found to be widely patent.  It was then accessed under direct ultrasound guidance without difficulty with a Seldinger needle and a permanent image was recorded.  A J-wire and 5-French sheath were  then placed.  Pigtail catheter was placed into the ascending aorta and a thoracic aortogram was then performed in the LAO projection. This demonstrated a bovine configuration of the great vessels.  The innominate artery and both carotid arteries appeared patent proximally.  There appeared to be a significant stenosis of the proximal left subclavian artery proximal to the vertebral artery on the original image.  The patient was given 5000 units of intravenous heparin and a headhunter catheter was used to selectively cannulate the left subclavian artery without difficulty.  This was then sequentially advanced to the brachial artery to opacify distally.  Findings of the left upper extremity angiogram demonstrated about an 85 to 90% stenosis of the proximal left subclavian artery proximal to the vertebral artery just beyond the origin of the subclavian artery.  The vessel normalized in the distal subclavian artery and axillary artery are patent.  The brachial artery was patent and there was a normal brachial bifurcation with normal radial and ulnar arteries distally.  I then placed an advantage wire and removed the headhunter catheter and placed a 6 French 70 cm sheath into the proximal left subclavian artery.  Using the previous images, a 7 mm diameter by 26 mm length lifestream stent was selected for the lesion and brought into the proximal left subclavian artery.  The sheath was then retracted into the aorta and a magnified view was performed and the stent was deployed in the proximal left subclavian artery with the distal and being proximal to the vertebral artery and the proximal endpoint being flush with the origin.  It was inflated to 12 atm.  Completion imaging showed less than 10% residual stenosis in the proximal left subclavian artery.  Oblique arteriogram was performed of the right femoral artery and StarClose closure device deployed in the usual fashion with excellent hemostatic result. The patient  tolerated the procedure well and was taken to the recovery room in stable condition.    Leotis Pain 08/18/2021 9:40 AM

## 2021-08-18 NOTE — Interval H&P Note (Signed)
History and Physical Interval Note:  08/18/2021 8:22 AM  [name]  has presented today for surgery, with the diagnosis of LUE Angio   Subclavian steal syndrome.  The various methods of treatment have been discussed with the patient and family. After consideration of risks, benefits and other options for treatment, the patient has consented to  Procedure(s): Upper Extremity Angiography (Left) as a surgical intervention.  The patient's history has been reviewed, patient examined, no change in status, stable for surgery.  I have reviewed the patient's chart and labs.  Questions were answered to the patient's satisfaction.     Festus Barren

## 2021-09-01 ENCOUNTER — Telehealth (INDEPENDENT_AMBULATORY_CARE_PROVIDER_SITE_OTHER): Payer: Self-pay | Admitting: Nurse Practitioner

## 2021-09-01 NOTE — Telephone Encounter (Signed)
I had a long discussion with the patient regarding her stent placement and DAPT.  She is less than 2 weeks post stent placement and cessation of DAPT could lead to stent occlusion.  I discussed with patient that there are no lower doses for plavix and that half a dose wouldn't be effective.  We discussed a full dose ASA but that places the patient at risk for possible thrombosis as well.  We discussed that if she can tolerate the bruising, it would be best to wait until she is 3 months stent placement.  Advised patient to place ICE on bruising to see if this would help with swelling around bruises

## 2021-09-01 NOTE — Telephone Encounter (Signed)
Patient came into office wanting to speak with NP Sheppard Plumber. Stating that she was having issues with the medication since having her stint done. When asked what medication it was the PLAVIX...Marland Kitchen she said she's having blood vessels popping all over her arms and legs and feet. She stated that she is also getting knots (when coming down) from this as well with one on her hip. She said its just a major issue for her and is needing some medical advise ASAP

## 2021-09-17 ENCOUNTER — Other Ambulatory Visit (INDEPENDENT_AMBULATORY_CARE_PROVIDER_SITE_OTHER): Payer: Self-pay | Admitting: Vascular Surgery

## 2021-09-17 DIAGNOSIS — Z95820 Peripheral vascular angioplasty status with implants and grafts: Secondary | ICD-10-CM

## 2021-09-17 DIAGNOSIS — G458 Other transient cerebral ischemic attacks and related syndromes: Secondary | ICD-10-CM

## 2021-09-19 ENCOUNTER — Encounter (INDEPENDENT_AMBULATORY_CARE_PROVIDER_SITE_OTHER): Payer: Self-pay | Admitting: Nurse Practitioner

## 2021-09-19 ENCOUNTER — Ambulatory Visit (INDEPENDENT_AMBULATORY_CARE_PROVIDER_SITE_OTHER): Payer: Medicare Other | Admitting: Nurse Practitioner

## 2021-09-19 ENCOUNTER — Ambulatory Visit (INDEPENDENT_AMBULATORY_CARE_PROVIDER_SITE_OTHER): Payer: Medicare Other

## 2021-09-19 VITALS — BP 131/71 | HR 92 | Resp 16 | Wt 148.4 lb

## 2021-09-19 DIAGNOSIS — G458 Other transient cerebral ischemic attacks and related syndromes: Secondary | ICD-10-CM

## 2021-09-19 DIAGNOSIS — Z95820 Peripheral vascular angioplasty status with implants and grafts: Secondary | ICD-10-CM

## 2021-09-19 DIAGNOSIS — E782 Mixed hyperlipidemia: Secondary | ICD-10-CM

## 2021-09-19 MED ORDER — CLOPIDOGREL BISULFATE 75 MG PO TABS: 75.0000 mg | ORAL_TABLET | Freq: Every day | ORAL | 2 refills | Status: DC

## 2021-10-07 ENCOUNTER — Encounter (INDEPENDENT_AMBULATORY_CARE_PROVIDER_SITE_OTHER): Payer: Self-pay | Admitting: Nurse Practitioner

## 2021-10-07 NOTE — Progress Notes (Signed)
Subjective:    Patient ID: [name], female    DOB: [birth year], 66 y.o.   MRN: [id number] Chief Complaint  Patient presents with   Follow-up    [facility] 4 week with ultrasound    The patient returns today for follow-up studies regarding her left subclavian stent placement.  The patient has signs and symptoms of subclavian steal including dizziness.  This is largely resolved.  Unfortunately the patient has also had significant bruising post intervention.  This is even without a precipitating event.  She also has a bruising notable on her face today.  However there has not been any blood in her stool.  No signs symptoms of any neurological changes.    Review of Systems  Hematological:  Bruises/bleeds easily.  All other systems reviewed and are negative.      Objective:   Physical Exam Vitals reviewed.  HENT:     Head: Normocephalic.  Cardiovascular:     Rate and Rhythm: Normal rate.     Pulses: Normal pulses.  Pulmonary:     Effort: Pulmonary effort is normal.  Skin:    General: Skin is warm and dry.  Neurological:     Mental Status: She is alert and oriented to person, place, and time.  Psychiatric:        Mood and Affect: Mood normal.        Behavior: Behavior normal.        Thought Content: Thought content normal.        Judgment: Judgment normal.     BP 131/71 (BP Location: Right Arm)   Pulse 92   Resp 16   Wt 148 lb 6.4 oz (67.3 kg)   BMI 25.47 kg/m   Past Medical History:  Diagnosis Date   Anemia    reportedly IDA; GIVENS performed by Dr. Elnoria Howard Oct 2009, no abnormalities   Anxiety    Carpal tunnel syndrome    Dizziness    GERD (gastroesophageal reflux disease)    Lupus (HCC) 2012   S/P colonoscopy Sept 2009   normal colon, large hemorrhoids   S/P endoscopy Sept 2009   Dr. Elnoria Howard: hiatal hernia   Vertigo     Social History   Socioeconomic History   Marital status: Widowed    Spouse name: Not on file   Number of children: 0   Years of  education: 14   Highest education level: Not on file  Occupational History   Occupation: Retired    Associate Professor: [employer]  Tobacco Use   Smoking status: Former    Types: Cigarettes    Quit date: 08/15/1998    Years since quitting: 23.1   Smokeless tobacco: Never   Tobacco comments:    Quit 2000  Vaping Use   Vaping Use: Never used  Substance and Sexual Activity   Alcohol use: Not Currently    Alcohol/week: 0.0 standard drinks of alcohol    Comment: occ   Drug use: No   Sexual activity: Not on file  Other Topics Concern   Not on file  Social History Narrative   Lives at home with husband.   Right-handed.   No caffeine use.   Social Determinants of Health   Financial Resource Strain: Not on file  Food Insecurity: Not on file  Transportation Needs: Not on file  Physical Activity: Not on file  Stress: Not on file  Social Connections: Not on file  Intimate Partner Violence: Not on file    Past  Surgical History:  Procedure Laterality Date   ADENOIDECTOMY     bilateral foot surgery     CARPAL TUNNEL RELEASE Right 04/07/2016   Procedure: RIGHT CARPAL TUNNEL RELEASE;  Surgeon: Cindee Salt, MD;  Location: [facility];  Service: Orthopedics;  Laterality: Right;  REG/FAB   CHOLECYSTECTOMY     endoscopy and colonoscopy  2009   Dr. Elnoria Howard: hiatal hernia, normal colon, large hemorrhoids   ESOPHAGOGASTRODUODENOSCOPY N/A 05/16/2021   Procedure: ESOPHAGOGASTRODUODENOSCOPY (EGD);  Surgeon: [name], DO;  Location: [facility];  Service: Gastroenterology;  Laterality: N/A;   GIVENS CAPSULE STUDY  Oct 2009   Dr. Elnoria Howard: normal   KNEE SURGERY Right    NISSEN FUNDOPLICATION     NOSE SURGERY     ROTATOR CUFF REPAIR     TONSILLECTOMY     UPPER EXTREMITY ANGIOGRAPHY Left 08/18/2021   Procedure: Upper Extremity Angiography;  Surgeon: Annice Needy, MD;  Location: [facility]IVE [facility];  Service: Cardiovascular;  Laterality: Left;    Family History  Problem  Relation Age of Onset   Breast cancer Sister    Anxiety disorder Mother    Diabetes Father    Cervical cancer Maternal Grandmother    Stroke Maternal Grandfather    Heart attack Paternal Grandfather    Colon cancer Neg Hx     Allergies  Allergen Reactions   Bismuth Subsalicylate Nausea And Vomiting   Cephalexin Other (See Comments)    Severe sore throat    Codeine Nausea And Vomiting   Famotidine Nausea And Vomiting       Latest Ref Rng & Units 02/11/2011    4:01 PM 02/12/2009    7:24 AM 02/15/2008   12:15 PM  CBC  WBC 4.0 - 10.5 K/uL 6.9   6.6   Hemoglobin 12.0 - 15.0 g/dL 41.6  60.6  30.1   Hematocrit 36.0 - 46.0 % 38.5   40.3   Platelets 150 - 400 K/uL 256   243       CMP     Component Value Date/Time   NA 140 01/30/2014 0000   K 4.1 01/30/2014 0000   CL 105 01/30/2014 0000   CO2 26 01/30/2014 0000   GLUCOSE 85 01/30/2014 0000   BUN 12 08/18/2021 0844   CREATININE 0.70 08/18/2021 0844   CREATININE 0.63 01/30/2014 0000   CALCIUM 9.0 01/30/2014 0000   PROT 7.0 01/30/2014 0000   ALBUMIN 4.2 01/30/2014 0000   AST 42 (H) 01/30/2014 0000   ALT 59 (H) 01/30/2014 0000   ALKPHOS 82 01/30/2014 0000   BILITOT 0.3 01/30/2014 0000   GFRNONAA >60 08/18/2021 0844   GFRNONAA >89 01/30/2014 0000   GFRAA >89 01/30/2014 0000     No results found.     Assessment & Plan:   1. Subclavian steal syndrome of left subclavian artery The patient's symptoms have subsided post stent placement however she is having issues with significant bruising.  Discussed with the patient that we can attempt to stop the Plavix or the aspirin however it does increase the risk of a possible thrombus.  However the patient is able to tolerate the bruising we can stop 90 days postintervention which would be on 11/18/2021.  We will plan on having the patient return in 3 months for reevaluation of her stent placement. - VAS [place] UPPER EXTREMITY ARTERIAL DUPLEX  2. Mixed hyperlipidemia Continue statin as  ordered and reviewed, no changes at this time    Current Outpatient Medications  on File Prior to Visit  Medication Sig Dispense Refill   alendronate (FOSAMAX) 70 MG tablet Take 70 mg by mouth once a week.     amphetamine-dextroamphetamine (ADDERALL) 10 MG tablet Take 10 mg by mouth daily.     aspirin EC 81 MG tablet Take 1 tablet (81 mg total) by mouth daily. Swallow whole. 150 tablet 2   aspirin-acetaminophen-caffeine (EXCEDRIN MIGRAINE) 250-250-65 MG tablet Take 1 tablet by mouth every 6 (six) hours as needed for headache.     atorvastatin (LIPITOR) 10 MG tablet Take 1 tablet (10 mg total) by mouth daily. 30 tablet 11   cyclobenzaprine (FLEXERIL) 10 MG tablet Cyclobenzaprine     diclofenac (VOLTAREN) 75 MG EC tablet Take 75 mg by mouth as needed.      esomeprazole (NEXIUM) 40 MG capsule Take by mouth.     ferrous sulfate 325 (65 FE) MG tablet Take 325 mg by mouth daily with breakfast.     hydroxychloroquine (PLAQUENIL) 200 MG tablet Take by mouth.     Simethicone 125 MG CAPS Simethicone     vitamin B-12 (CYANOCOBALAMIN) 500 MCG tablet Take 500 mcg by mouth daily.     Biotin 10 MG CAPS Take by mouth. (Patient not taking: Reported on 08/07/2021)     escitalopram (LEXAPRO) 20 MG tablet Take 20 mg by mouth daily. (Patient not taking: Reported on 08/18/2021)     No current facility-administered medications on file prior to visit.    There are no Patient Instructions on file for this visit. No follow-ups on file.   Georgiana Spinner, NP

## 2021-11-20 ENCOUNTER — Other Ambulatory Visit: Payer: Self-pay | Admitting: Student

## 2021-11-20 DIAGNOSIS — M791 Myalgia, unspecified site: Secondary | ICD-10-CM

## 2021-11-20 DIAGNOSIS — R200 Anesthesia of skin: Secondary | ICD-10-CM

## 2021-11-24 ENCOUNTER — Ambulatory Visit
Admission: RE | Admit: 2021-11-24 | Discharge: 2021-11-24 | Disposition: A | Discharge status: Home / Self Care | Payer: Medicare Other | Source: Ambulatory Visit | Attending: Student | Admitting: Student

## 2021-11-24 ENCOUNTER — Other Ambulatory Visit: Payer: Self-pay | Admitting: Student

## 2021-11-24 DIAGNOSIS — R200 Anesthesia of skin: Secondary | ICD-10-CM

## 2021-11-24 DIAGNOSIS — M791 Myalgia, unspecified site: Secondary | ICD-10-CM

## 2021-11-24 MED ORDER — GADOBUTROL 1 MMOL/ML IV SOLN
6.0000 mL | Freq: Once | INTRAVENOUS | Status: AC | PRN
  Administered 2021-11-24: 6 mL via INTRAVENOUS

## 2021-12-01 ENCOUNTER — Encounter (INDEPENDENT_AMBULATORY_CARE_PROVIDER_SITE_OTHER): Payer: Self-pay

## 2021-12-12 ENCOUNTER — Other Ambulatory Visit (INDEPENDENT_AMBULATORY_CARE_PROVIDER_SITE_OTHER): Payer: Self-pay | Admitting: Vascular Surgery

## 2021-12-12 DIAGNOSIS — I779 Disorder of arteries and arterioles, unspecified: Secondary | ICD-10-CM

## 2021-12-15 NOTE — Progress Notes (Unsigned)
Referring Physician:  Lonell Face, MD [phone] [facility] [address] [facility] [place],  [place] [address]  Primary Physician:  Patrice Paradise, MD  History of Present Illness: 12/18/2021 [name] is here today with a chief complaint of occasional balance issues and she has had vertigo.  She reports tingling all over her body.  She has some burning in the back of her throat and some difficulty with swallowing.  She has seen GI and has had a swallow study.  She has significant vascular issues and is currently on aspirin and Plavix for recent stent placement.  She will go off Plavix in January.  She reports that she has since she hit the back of her head in 2015.  She has occasional balance issues but has not had major falls.   Bowel/Bladder Dysfunction: none  Conservative measures: ice, massager Physical therapy: has not participated  Multimodal medical therapy including regular antiinflammatories:  flexeril, gabapentin Injections: has not received epidural steroid injections  Past Surgery: denies  [name] has minimal symptoms of cervical myelopathy.  The symptoms are causing a significant impact on the patient's life.   Review of Systems:  A 10 point review of systems is negative, except for the pertinent positives and negatives detailed in the HPI.  Past Medical History: Past Medical History:  Diagnosis Date   Anemia    reportedly IDA; GIVENS performed by Dr. Elnoria Howard Oct 2009, no abnormalities   Anxiety    Carpal tunnel syndrome    Dizziness    GERD (gastroesophageal reflux disease)    Lupus (HCC) 2012   S/P colonoscopy Sept 2009   normal colon, large hemorrhoids   S/P endoscopy Sept 2009   Dr. Elnoria Howard: hiatal hernia   Vertigo     Past Surgical History: Past Surgical History:  Procedure Laterality Date   ADENOIDECTOMY     bilateral foot surgery     CARPAL TUNNEL RELEASE Right 04/07/2016   Procedure: RIGHT CARPAL TUNNEL  RELEASE;  Surgeon: Cindee Salt, MD;  Location: [facility];  Service: Orthopedics;  Laterality: Right;  REG/FAB   CHOLECYSTECTOMY     endoscopy and colonoscopy  2009   Dr. Elnoria Howard: hiatal hernia, normal colon, large hemorrhoids   ESOPHAGOGASTRODUODENOSCOPY N/A 05/16/2021   Procedure: ESOPHAGOGASTRODUODENOSCOPY (EGD);  Surgeon: [name], DO;  Location: [facility];  Service: Gastroenterology;  Laterality: N/A;   GIVENS CAPSULE STUDY  Oct 2009   Dr. Elnoria Howard: normal   KNEE SURGERY Right    NISSEN FUNDOPLICATION     NOSE SURGERY     ROTATOR CUFF REPAIR     TONSILLECTOMY     UPPER EXTREMITY ANGIOGRAPHY Left 08/18/2021   Procedure: Upper Extremity Angiography;  Surgeon: Annice Needy, MD;  Location: [facility] INV[facility]IVE CV LAB;  Service: Cardiovascular;  Laterality: Left;    Allergies: Allergies as of 12/18/2021 - Review Complete 12/18/2021  Allergen Reaction Noted   Bismuth subsalicylate Nausea And Vomiting 01/21/2011   Cephalexin Other (See Comments) 08/12/2020   Codeine Nausea And Vomiting 01/21/2011   Famotidine Nausea And Vomiting 02/19/2017    Medications: Current Meds  Medication Sig   ascorbic acid (VITAMIN C) 500 MG tablet Take 500 mg by mouth daily.   aspirin EC 325 MG tablet Take 325 mg by mouth daily.   cholecalciferol (VITAMIN D3) 25 MCG (1000 UNIT) tablet Take 1,000 Units by mouth daily.   Plant Sterols and Stanols (CHOLESTOFF PLUS PO) Take by mouth daily.  Social History: Social History   Tobacco Use   Smoking status: Former    Types: Cigarettes    Quit date: 08/15/1998    Years since quitting: 23.3   Smokeless tobacco: Never   Tobacco comments:    Quit 2000  Vaping Use   Vaping Use: Never used  Substance Use Topics   Alcohol use: Not Currently    Alcohol/week: 0.0 standard drinks of alcohol    Comment: occ   Drug use: No    Family Medical History: Family History  Problem Relation Age of Onset   Breast cancer Sister    Anxiety disorder  Mother    Diabetes Father    Cervical cancer Maternal Grandmother    Stroke Maternal Grandfather    Heart attack Paternal Grandfather    Colon cancer Neg Hx     Physical Examination: Vitals:   12/18/21 1003  BP: (!) 153/86  Pulse: 87    General: Patient is well developed, well nourished, calm, collected, and in no apparent distress. Attention to examination is appropriate.  Neck:   Supple.  Full range of motion.  Respiratory: Patient is breathing without any difficulty.   NEUROLOGICAL:     Awake, alert, oriented to person, place, and time.  Speech is clear and fluent. Fund of knowledge is appropriate.   Cranial Nerves: Pupils equal round and reactive to light.  Facial tone is symmetric.  Facial sensation is symmetric. Shoulder shrug is symmetric. Tongue protrusion is midline.  There is no pronator drift.  ROM of spine: full.    Strength: Side Biceps Triceps Deltoid Interossei Grip Wrist Ext. Wrist Flex.  R 5 5 5 5 5 5 5   L 5 5 5 5 5 5 5    Side Iliopsoas Quads Hamstring PF DF EHL  R 5 5 5 5 5 5   L 5 5 5 5 5 5    Reflexes are 1+ and symmetric at the biceps, triceps, brachioradialis, patella and achilles.   Hoffman's is absent.   Bilateral upper and lower extremity sensation is intact to light touch.    No evidence of dysmetria noted.      Medical Decision Making  Imaging: MRI C spine 11/24/2021 C3-4: Chronic degenerative spondylosis with endplate osteophytes and bulging of the disc. Posterior ligamentous prominence. Spinal stenosis with AP diameter of the canal in the midline 6.3 mm. Effacement of the subarachnoid space and slight indentation of the cord. No abnormal cord T2 signal. Bilateral bony foraminal narrowing could affect either C4 nerve. The stenosis has worsened since 2018 when the AP diameter of the canal with 6.9 mm.   C4-5: Spondylosis with endplate osteophytes and bulging of the disc. Narrowing of the ventral subarachnoid space but no compression  of cord. Facet osteoarthritis on the right. No compressive canal narrowing. Mild bony foraminal narrowing on the right. Slight worsening since 2018.   C5-6: Spondylosis with endplate osteophytes and bulging of the disc. Mild narrowing of the ventral subarachnoid space but no compressive effect upon the cord. Osteophytic encroachment upon the foramen on the left could possibly affect the left C6 nerve. Slight worsening since 2018.   C6-7: Shallow disc protrusion indents the ventral subarachnoid space but does not affect cord. Mild left foraminal stenosis without definite affect upon the exiting C7 nerve. Similar appearance to the study of 2018.   C7-T1: No disc abnormality.  Minimal facet arthritis.  No stenosis.   IMPRESSION: 1. C3-4: Chronic degenerative spondylosis. Spinal stenosis with AP diameter of the canal in  the midline 6.3 mm. Effacement of the subarachnoid space and slight indentation of the cord. No abnormal cord T2 signal. The stenosis has worsened since 2018. Bilateral bony foraminal narrowing could affect either C4 nerve. 2. C4-5: Spondylosis and facet osteoarthritis on the right. Mild bony foraminal narrowing on the right. Slight worsening since 2018. 3. C5-6: Spondylosis. Foraminal narrowing on the left could possibly affect the left C6 nerve. Slight worsening since 2018. 4. C6-7: Shallow disc protrusion without cord compression. Mild left foraminal narrowing without definite affect upon the left C7 nerve. Similar appearance to the study of 2018.     Electronically Signed   By: Paulina Fusi M.D.   On: 11/26/2021 09:40  I have personally reviewed the images and agree with the above interpretation.  Assessment and Plan: Ms. Korber is a pleasant 66 y.o. female with dysphagia as well as minimal symptoms of cervical myelopathy from cervical stenosis.  At this point, her symptoms are very mild.  She is also on Plavix for peripheral vascular disease.  I will defer  further treatment until she is off Plavix.  We will rediscuss in January when she stops taking Plavix.  We will obtain her swallow study images to review with her.   I spent a total of 30 minutes in face-to-face and non-face-to-face activities related to this patient's care today.  Thank you for involving me in the care of this patient.      Norvella Loscalzo K. Myer Haff MD, [facility]

## 2021-12-17 ENCOUNTER — Encounter (INDEPENDENT_AMBULATORY_CARE_PROVIDER_SITE_OTHER): Payer: Self-pay | Admitting: Nurse Practitioner

## 2021-12-17 ENCOUNTER — Ambulatory Visit (INDEPENDENT_AMBULATORY_CARE_PROVIDER_SITE_OTHER): Payer: Medicare Other

## 2021-12-17 ENCOUNTER — Ambulatory Visit (INDEPENDENT_AMBULATORY_CARE_PROVIDER_SITE_OTHER): Payer: Medicare Other | Admitting: Nurse Practitioner

## 2021-12-17 VITALS — BP 156/83 | HR 92 | Resp 16 | Wt 150.6 lb

## 2021-12-17 DIAGNOSIS — G458 Other transient cerebral ischemic attacks and related syndromes: Secondary | ICD-10-CM

## 2021-12-17 DIAGNOSIS — I779 Disorder of arteries and arterioles, unspecified: Secondary | ICD-10-CM

## 2021-12-17 DIAGNOSIS — E782 Mixed hyperlipidemia: Secondary | ICD-10-CM

## 2021-12-17 MED ORDER — CLOPIDOGREL BISULFATE 75 MG PO TABS: 75.0000 mg | ORAL_TABLET | Freq: Every day | ORAL | 2 refills | Status: DC

## 2021-12-18 ENCOUNTER — Encounter: Payer: Self-pay | Admitting: Neurosurgery

## 2021-12-18 ENCOUNTER — Ambulatory Visit (INDEPENDENT_AMBULATORY_CARE_PROVIDER_SITE_OTHER): Payer: Medicare Other | Admitting: Neurosurgery

## 2021-12-18 VITALS — BP 153/86 | HR 87 | Ht 64.0 in | Wt 151.0 lb

## 2021-12-18 DIAGNOSIS — G959 Disease of spinal cord, unspecified: Secondary | ICD-10-CM

## 2021-12-18 DIAGNOSIS — M4802 Spinal stenosis, cervical region: Secondary | ICD-10-CM

## 2021-12-22 ENCOUNTER — Other Ambulatory Visit: Payer: Self-pay

## 2021-12-22 ENCOUNTER — Inpatient Hospital Stay
Admission: RE | Admit: 2021-12-22 | Discharge: 2021-12-22 | Disposition: A | Discharge status: Home / Self Care | Payer: Self-pay | Source: Ambulatory Visit | Attending: Neurosurgery | Admitting: Neurosurgery

## 2021-12-22 DIAGNOSIS — Z049 Encounter for examination and observation for unspecified reason: Secondary | ICD-10-CM

## 2022-01-04 ENCOUNTER — Encounter (INDEPENDENT_AMBULATORY_CARE_PROVIDER_SITE_OTHER): Payer: Self-pay | Admitting: Nurse Practitioner

## 2022-01-04 NOTE — Progress Notes (Signed)
Subjective:    Patient ID: [name], female    DOB: [birth year], 66 y.o.   MRN: [id number] Chief Complaint  Patient presents with   Follow-up    Ultrasound follow up    The patient returns today for follow-up studies regarding her left subclavian stent placement.  The patient has signs and symptoms of subclavian steal including dizziness.  This is largely resolved.  Unfortunately the patient has also had significant bruising post intervention.  There is not any significant bleeding events such as bloody stools.  Otherwise she continues to tolerate Plavix without issue.  Today noninvasive studies show a right ICA stenosis with a 1 to 39% stenosis the left has a 40 to 59% stenosis.  Normal flow hemodynamics in the bilateral subclavian arteries with antegrade flow in the bilateral vertebral arteries no evidence of steal noted on ultrasound today.    Review of Systems  Neurological:  Negative for dizziness.  Hematological:  Bruises/bleeds easily.  All other systems reviewed and are negative.      Objective:   Physical Exam Vitals reviewed.  HENT:     Head: Normocephalic.  Cardiovascular:     Rate and Rhythm: Normal rate.     Pulses:          Radial pulses are 1+ on the right side and 1+ on the left side.  Pulmonary:     Effort: Pulmonary effort is normal.  Skin:    General: Skin is warm.  Neurological:     Mental Status: She is alert and oriented to person, place, and time.  Psychiatric:        Mood and Affect: Mood normal.        Behavior: Behavior normal.        Thought Content: Thought content normal.        Judgment: Judgment normal.     BP (!) 156/83 (BP Location: Right Arm)   Pulse 92   Resp 16   Wt 150 lb 9.6 oz (68.3 kg)   BMI 25.85 kg/m   Past Medical History:  Diagnosis Date   Anemia    reportedly IDA; GIVENS performed by Dr. Elnoria Howard Oct 2009, no abnormalities   Anxiety    Carpal tunnel syndrome    Dizziness    GERD (gastroesophageal reflux  disease)    Lupus (HCC) 2012   S/P colonoscopy Sept 2009   normal colon, large hemorrhoids   S/P endoscopy Sept 2009   Dr. Elnoria Howard: hiatal hernia   Vertigo     Social History   Socioeconomic History   Marital status: Widowed    Spouse name: Not on file   Number of children: 0   Years of education: 14   Highest education level: Not on file  Occupational History   Occupation: Retired    Associate Professor: [employer]  Tobacco Use   Smoking status: Former    Types: Cigarettes    Quit date: 08/15/1998    Years since quitting: 23.4   Smokeless tobacco: Never   Tobacco comments:    Quit 2000  Vaping Use   Vaping Use: Never used  Substance and Sexual Activity   Alcohol use: Not Currently    Alcohol/week: 0.0 standard drinks of alcohol    Comment: occ   Drug use: No   Sexual activity: Not on file  Other Topics Concern   Not on file  Social History Narrative   Lives at home with husband.   Right-handed.   No caffeine  use.   Social Determinants of Health   Financial Resource Strain: Not on file  Food Insecurity: Not on file  Transportation Needs: Not on file  Physical Activity: Not on file  Stress: Not on file  Social Connections: Not on file  Intimate Partner Violence: Not on file    Past Surgical History:  Procedure Laterality Date   ADENOIDECTOMY     bilateral foot surgery     CARPAL TUNNEL RELEASE Right 04/07/2016   Procedure: RIGHT CARPAL TUNNEL RELEASE;  Surgeon: Cindee Salt, MD;  Location: [facility];  Service: Orthopedics;  Laterality: Right;  REG/FAB   CHOLECYSTECTOMY     endoscopy and colonoscopy  2009   Dr. Elnoria Howard: hiatal hernia, normal colon, large hemorrhoids   ESOPHAGOGASTRODUODENOSCOPY N/A 05/16/2021   Procedure: ESOPHAGOGASTRODUODENOSCOPY (EGD);  Surgeon: [name], DO;  Location: [facility];  Service: Gastroenterology;  Laterality: N/A;   GIVENS CAPSULE STUDY  Oct 2009   Dr. Elnoria Howard: normal   KNEE SURGERY Right    NISSEN  FUNDOPLICATION     NOSE SURGERY     ROTATOR CUFF REPAIR     TONSILLECTOMY     UPPER EXTREMITY ANGIOGRAPHY Left 08/18/2021   Procedure: Upper Extremity Angiography;  Surgeon: Annice Needy, MD;  Location: [facility];  Service: Cardiovascular;  Laterality: Left;    Family History  Problem Relation Age of Onset   Breast cancer Sister    Anxiety disorder Mother    Diabetes Father    Cervical cancer Maternal Grandmother    Stroke Maternal Grandfather    Heart attack Paternal Grandfather    Colon cancer Neg Hx     Allergies  Allergen Reactions   Bismuth Subsalicylate Nausea And Vomiting   Cephalexin Other (See Comments)    Severe sore throat    Codeine Nausea And Vomiting   Famotidine Nausea And Vomiting       Latest Ref Rng & Units 02/11/2011    4:01 PM 02/12/2009    7:24 AM 02/15/2008   12:15 PM  CBC  WBC 4.0 - 10.5 K/uL 6.9   6.6   Hemoglobin 12.0 - 15.0 g/dL 08.6  57.8  46.9   Hematocrit 36.0 - 46.0 % 38.5   40.3   Platelets 150 - 400 K/uL 256   243       CMP     Component Value Date/Time   NA 140 01/30/2014 0000   K 4.1 01/30/2014 0000   CL 105 01/30/2014 0000   CO2 26 01/30/2014 0000   GLUCOSE 85 01/30/2014 0000   BUN 12 08/18/2021 0844   CREATININE 0.70 08/18/2021 0844   CREATININE 0.63 01/30/2014 0000   CALCIUM 9.0 01/30/2014 0000   PROT 7.0 01/30/2014 0000   ALBUMIN 4.2 01/30/2014 0000   AST 42 (H) 01/30/2014 0000   ALT 59 (H) 01/30/2014 0000   ALKPHOS 82 01/30/2014 0000   BILITOT 0.3 01/30/2014 0000   GFRNONAA >60 08/18/2021 0844   GFRNONAA >89 01/30/2014 0000   GFRAA >89 01/30/2014 0000     No results found.     Assessment & Plan:   1. Subclavian steal syndrome of left subclavian artery Given the significant bruising the patient has had we will continue the Plavix for approximately 2 months which will give her approximately 6 months with dual antiplatelet therapy.  At which time she will stop her Plavix and continue with aspirin.  The  patient does have some noted carotid stenosis however these are still  very mild and currently no role for intervention.  We will have the patient return in 6 months for noninvasive studies. - VAS US CAROTID - clopidogrel (PLAVIX) 75 MG tablet; Take 1 tablet (75 mg total) by mouth daily.  Dispense: 30 tablet; Refill: 2  2. Mixed hyperlipidemia Continue statin as ordered and reviewed, no changes at this time   Current Outpatient Medications on File Prior to Visit  Medication Sig Dispense Refill   alendronate (FOSAMAX) 70 MG tablet Take 70 mg by mouth once a week.     amphetamine-dextroamphetamine (ADDERALL) 10 MG tablet Take 10 mg by mouth daily.     aspirin-acetaminophen-caffeine (EXCEDRIN MIGRAINE) 250-250-65 MG tablet Take 1 tablet by mouth every 6 (six) hours as needed for headache.     cyclobenzaprine (FLEXERIL) 10 MG tablet Take 10 mg by mouth.     diclofenac (VOLTAREN) 75 MG EC tablet Take 75 mg by mouth as needed.      esomeprazole (NEXIUM) 40 MG capsule Take by mouth.     ferrous sulfate 325 (65 FE) MG tablet Take 325 mg by mouth daily with breakfast.     gabapentin (NEURONTIN) 100 MG capsule Take 200 mg by mouth 2 (two) times daily.     hydroxychloroquine (PLAQUENIL) 200 MG tablet Take by mouth.     Simethicone 125 MG CAPS Simethicone     vitamin B-12 (CYANOCOBALAMIN) 500 MCG tablet Take 500 mcg by mouth daily.     escitalopram (LEXAPRO) 20 MG tablet Take 20 mg by mouth daily.     No current facility-administered medications on file prior to visit.    There are no Patient Instructions on file for this visit. No follow-ups on file.   Georgiana Spinner, NP

## 2022-02-26 ENCOUNTER — Ambulatory Visit (INDEPENDENT_AMBULATORY_CARE_PROVIDER_SITE_OTHER): Payer: Medicare Other | Admitting: Neurosurgery

## 2022-02-26 VITALS — BP 128/74 | HR 81 | Wt 146.2 lb

## 2022-02-26 DIAGNOSIS — R1319 Other dysphagia: Secondary | ICD-10-CM

## 2022-02-26 NOTE — Progress Notes (Signed)
Referring Physician:  Marinda Elk, MD [place] [facility] [place],   [id number]  Primary Physician:  Marinda Elk, MD  History of Present Illness: 02/26/2022 [name] returns to see me.  She is here today to review her images.  12/18/2021 [name] is here today with a chief complaint of occasional balance issues and she has had vertigo.  She reports tingling all over her body.  She has some burning in the back of her throat and some difficulty with swallowing.  She has seen GI and has had a swallow study.  She has significant vascular issues and is currently on aspirin and Plavix for recent stent placement.  She will go off Plavix in January.  She reports that she has since she hit the back of her head in 2015.  She has occasional balance issues but has not had major falls.   Bowel/Bladder Dysfunction: none  Conservative measures: ice, massager Physical therapy: has not participated  Multimodal medical therapy including regular antiinflammatories:  flexeril, gabapentin Injections: has not received epidural steroid injections  Past Surgery: denies  [name] has minimal symptoms of cervical myelopathy.  The symptoms are causing a significant impact on the patient's life.   Review of Systems:  A 10 point review of systems is negative, except for the pertinent positives and negatives detailed in the HPI.  Past Medical History: Past Medical History:  Diagnosis Date   Anemia    reportedly IDA; GIVENS performed by Dr. Benson Norway Oct 2009, no abnormalities   Anxiety    Carpal tunnel syndrome    Dizziness    GERD (gastroesophageal reflux disease)    Lupus ([place]) 2012   S/P colonoscopy Sept 2009   normal colon, large hemorrhoids   S/P endoscopy Sept 2009   Dr. Benson Norway: hiatal hernia   Vertigo     Past Surgical History: Past Surgical History:  Procedure Laterality Date   ADENOIDECTOMY     bilateral foot  surgery     CARPAL TUNNEL RELEASE Right 04/07/2016   Procedure: RIGHT CARPAL TUNNEL RELEASE;  Surgeon: Daryll Brod, MD;  Location: [facility];  Service: Orthopedics;  Laterality: Right;  REG/FAB   CHOLECYSTECTOMY     endoscopy and colonoscopy  2009   Dr. Benson Norway: hiatal hernia, normal colon, large hemorrhoids   ESOPHAGOGASTRODUODENOSCOPY N/A 05/16/2021   Procedure: ESOPHAGOGASTRODUODENOSCOPY (EGD);  Surgeon: Annamaria Helling, DO;  Location: [facility] END[facility]COPY;  Service: Gastroenterology;  Laterality: N/A;   GIVENS CAPSULE STUDY  Oct 2009   Dr. Benson Norway: normal   KNEE SURGERY Right    NISSEN FUNDOPLICATION     NOSE SURGERY     ROTATOR CUFF REPAIR     TONSILLECTOMY     UPPER EXTREMITY ANGIOGRAPHY Left 08/18/2021   Procedure: Upper Extremity Angiography;  Surgeon: Algernon Huxley, MD;  Location: [facility];  Service: Cardiovascular;  Laterality: Left;    Allergies: Allergies as of 02/26/2022 - Review Complete 01/04/2022  Allergen Reaction Noted   Bismuth subsalicylate Nausea And Vomiting 01/21/2011   Cephalexin Other (See Comments) 08/12/2020   Codeine Nausea And Vomiting 01/21/2011   Famotidine Nausea And Vomiting 02/19/2017    Medications: No outpatient medications have been marked as taking for the 02/26/22 encounter (Office Visit) with Meade Maw, MD.    Social History: Social History   Tobacco Use   Smoking status: Former    Types: Cigarettes    Quit date: 08/15/1998  Years since quitting: 23.5   Smokeless tobacco: Never   Tobacco comments:    Quit 2000  Vaping Use   Vaping Use: Never used  Substance Use Topics   Alcohol use: Not Currently    Alcohol/week: 0.0 standard drinks of alcohol    Comment: occ   Drug use: No    Family Medical History: Family History  Problem Relation Age of Onset   Breast cancer Sister    Anxiety disorder Mother    Diabetes Father    Cervical cancer Maternal Grandmother    Stroke Maternal Grandfather    Heart  attack Paternal Grandfather    Colon cancer Neg Hx     Physical Examination: Vitals:   02/26/22 1115  BP: 128/74  Pulse: 81    General: Patient is well developed, well nourished, calm, collected, and in no apparent distress. Attention to examination is appropriate.  Neck:   Supple.  Full range of motion.  Respiratory: Patient is breathing without any difficulty.   NEUROLOGICAL:     Awake, alert, oriented to person, place, and time.  Speech is clear and fluent. Fund of knowledge is appropriate.  MAEW   Medical Decision Making  Imaging: MRI C spine 11/24/2021 C3-4: Chronic degenerative spondylosis with endplate osteophytes and bulging of the disc. Posterior ligamentous prominence. Spinal stenosis with AP diameter of the canal in the midline 6.3 mm. Effacement of the subarachnoid space and slight indentation of the cord. No abnormal cord T2 signal. Bilateral bony foraminal narrowing could affect either C4 nerve. The stenosis has worsened since 2018 when the AP diameter of the canal with 6.9 mm.   C4-5: Spondylosis with endplate osteophytes and bulging of the disc. Narrowing of the ventral subarachnoid space but no compression of cord. Facet osteoarthritis on the right. No compressive canal narrowing. Mild bony foraminal narrowing on the right. Slight worsening since 2018.   C5-6: Spondylosis with endplate osteophytes and bulging of the disc. Mild narrowing of the ventral subarachnoid space but no compressive effect upon the cord. Osteophytic encroachment upon the foramen on the left could possibly affect the left C6 nerve. Slight worsening since 2018.   C6-7: Shallow disc protrusion indents the ventral subarachnoid space but does not affect cord. Mild left foraminal stenosis without definite affect upon the exiting C7 nerve. Similar appearance to the study of 2018.   C7-T1: No disc abnormality.  Minimal facet arthritis.  No stenosis.   IMPRESSION: 1. C3-4: Chronic  degenerative spondylosis. Spinal stenosis with AP diameter of the canal in the midline 6.3 mm. Effacement of the subarachnoid space and slight indentation of the cord. No abnormal cord T2 signal. The stenosis has worsened since 2018. Bilateral bony foraminal narrowing could affect either C4 nerve. 2. C4-5: Spondylosis and facet osteoarthritis on the right. Mild bony foraminal narrowing on the right. Slight worsening since 2018. 3. C5-6: Spondylosis. Foraminal narrowing on the left could possibly affect the left C6 nerve. Slight worsening since 2018. 4. C6-7: Shallow disc protrusion without cord compression. Mild left foraminal narrowing without definite affect upon the left C7 nerve. Similar appearance to the study of 2018.     Electronically Signed   By: Paulina Fusi M.D.   On: 11/26/2021 09:40  I have personally reviewed the images and agree with the above interpretation.  Assessment and Plan: [name] is a pleasant 67 y.o. female with dysphagia as well as minimal symptoms of cervical myelopathy from cervical stenosis.  We reviewed her swallow study.  And the swallow  study read, there is mention of mild impression of the posterior esophagus from cervical osteophytes.  These are very small in my opinion.  I do not think that these are the proximate cause of her swallowing disorder.  I think it would be inappropriate to remove the osteophytes given how small they are.    I have encouraged her to return to care with the gastroenterologist as she there may be additional studies that she needs to help evaluate why her swallowing has become so difficult.  I spent a total of 15 minutes in face-to-face and non-face-to-face activities related to this patient's care today.  Thank you for involving me in the care of this patient.      Judah Chevere K. Izora Ribas MD, [facility] Neurosurgery

## 2022-06-08 ENCOUNTER — Other Ambulatory Visit (INDEPENDENT_AMBULATORY_CARE_PROVIDER_SITE_OTHER): Payer: Self-pay | Admitting: Nurse Practitioner

## 2022-06-08 DIAGNOSIS — G458 Other transient cerebral ischemic attacks and related syndromes: Secondary | ICD-10-CM

## 2022-06-09 ENCOUNTER — Ambulatory Visit (INDEPENDENT_AMBULATORY_CARE_PROVIDER_SITE_OTHER): Payer: Medicare Other | Admitting: Vascular Surgery

## 2022-06-09 ENCOUNTER — Ambulatory Visit (INDEPENDENT_AMBULATORY_CARE_PROVIDER_SITE_OTHER): Payer: Medicare Other

## 2022-06-09 VITALS — BP 130/85 | HR 72 | Resp 17 | Ht 64.0 in | Wt 157.0 lb

## 2022-06-09 DIAGNOSIS — E782 Mixed hyperlipidemia: Secondary | ICD-10-CM | POA: Diagnosis not present

## 2022-06-09 DIAGNOSIS — G458 Other transient cerebral ischemic attacks and related syndromes: Secondary | ICD-10-CM

## 2022-06-09 DIAGNOSIS — I6523 Occlusion and stenosis of bilateral carotid arteries: Secondary | ICD-10-CM

## 2022-06-09 DIAGNOSIS — I6529 Occlusion and stenosis of unspecified carotid artery: Secondary | ICD-10-CM | POA: Insufficient documentation

## 2022-06-09 NOTE — Progress Notes (Signed)
MRN : [id number]  [name] is a 67 y.o. (1955/05/06) female who presents with chief complaint of  Chief Complaint  Patient presents with   Follow-up  .  History of Present Illness: Patient returns today in follow up of carotid and subclavian disease.  She is doing well.  About a year ago, she underwent left subclavian stent placement for left subclavian steal syndrome.  She is doing well.  She has significant improvement in her left arm symptoms and no current vertebrobasilar symptoms.  She denies any arm or leg weakness or numbness, speech or swallowing difficulty, or temporary monocular blindness.  Her duplex today shows 1 to 39% right ICA stenosis and 40 to 59% left ICA stenosis.  Both subclavian arteries appear triphasic and normal in both vertebral arteries are antegrade.  Current Outpatient Medications  Medication Sig Dispense Refill   alendronate (FOSAMAX) 70 MG tablet Take 70 mg by mouth once a week.     amphetamine-dextroamphetamine (ADDERALL) 10 MG tablet Take 10 mg by mouth daily.     ascorbic acid (VITAMIN C) 500 MG tablet Take 500 mg by mouth daily.     aspirin EC 325 MG tablet Take 325 mg by mouth daily.     aspirin-acetaminophen-caffeine (EXCEDRIN MIGRAINE) 250-250-65 MG tablet Take 1 tablet by mouth every 6 (six) hours as needed for headache.     azelastine (ASTELIN) 0.1 % nasal spray Place into both nostrils 2 (two) times daily. Use in each nostril as directed     cholecalciferol (VITAMIN D3) 25 MCG (1000 UNIT) tablet Take 1,000 Units by mouth daily.     cyclobenzaprine (FLEXERIL) 10 MG tablet Take 10 mg by mouth.     diclofenac (VOLTAREN) 75 MG EC tablet Take 75 mg by mouth as needed.      escitalopram (LEXAPRO) 20 MG tablet Take 20 mg by mouth daily.     esomeprazole (NEXIUM) 40 MG capsule Take by mouth.     ferrous sulfate 325 (65 FE) MG tablet Take 325 mg by mouth daily with breakfast.     fluticasone (FLONASE) 50 MCG/ACT nasal spray Place into both  nostrils daily.     gabapentin (NEURONTIN) 100 MG capsule Take 200 mg by mouth 2 (two) times daily.     hydroxychloroquine (PLAQUENIL) 200 MG tablet Take by mouth.     Plant Sterols and Stanols (CHOLESTOFF PLUS PO) Take by mouth daily.     Simethicone 125 MG CAPS Simethicone     vitamin B-12 (CYANOCOBALAMIN) 500 MCG tablet Take 500 mcg by mouth daily.     No current facility-administered medications for this visit.    Past Medical History:  Diagnosis Date   Anemia    reportedly IDA; GIVENS performed by Dr. Elnoria Howard Oct 2009, no abnormalities   Anxiety    Carpal tunnel syndrome    Dizziness    GERD (gastroesophageal reflux disease)    Lupus (HCC) 2012   S/P colonoscopy Sept 2009   normal colon, large hemorrhoids   S/P endoscopy Sept 2009   Dr. Elnoria Howard: hiatal hernia   Vertigo     Past Surgical History:  Procedure Laterality Date   ADENOIDECTOMY     bilateral foot surgery     CARPAL TUNNEL RELEASE Right 04/07/2016   Procedure: RIGHT CARPAL TUNNEL RELEASE;  Surgeon: Cindee Salt, MD;  Location: [facility];  Service: Orthopedics;  Laterality: Right;  REG/FAB   CHOLECYSTECTOMY     endoscopy and colonoscopy  2009  Dr. Elnoria Howard: hiatal hernia, normal colon, large hemorrhoids   ESOPHAGOGASTRODUODENOSCOPY N/A 05/16/2021   Procedure: ESOPHAGOGASTRODUODENOSCOPY (EGD);  Surgeon: [name], DO;  Location: [facility] ENDOSCOPY;  Service: Gastroenterology;  Laterality: N/A;   GIVENS CAPSULE STUDY  Oct 2009   Dr. Elnoria Howard: normal   KNEE SURGERY Right    NISSEN FUNDOPLICATION     NOSE SURGERY     ROTATOR CUFF REPAIR     TONSILLECTOMY     UPPER EXTREMITY ANGIOGRAPHY Left 08/18/2021   Procedure: Upper Extremity Angiography;  Surgeon: Annice Needy, MD;  Location: [facility] INVASIVE CV LAB;  Service: Cardiovascular;  Laterality: Left;     Social History   Tobacco Use   Smoking status: Former    Types: Cigarettes    Quit date: 08/15/1998    Years since quitting: 23.8   Smokeless tobacco:  Never   Tobacco comments:    Quit 2000  Vaping Use   Vaping Use: Never used  Substance Use Topics   Alcohol use: Not Currently    Alcohol/week: 0.0 standard drinks of alcohol    Comment: occ   Drug use: No       Family History  Problem Relation Age of Onset   Breast cancer Sister    Anxiety disorder Mother    Diabetes Father    Cervical cancer Maternal Grandmother    Stroke Maternal Grandfather    Heart attack Paternal Grandfather    Colon cancer Neg Hx      Allergies  Allergen Reactions   Bismuth Subsalicylate Nausea And Vomiting   Cephalexin Other (See Comments)    Severe sore throat    Codeine Nausea And Vomiting   Famotidine Nausea And Vomiting     REVIEW OF SYSTEMS (Negative unless checked)  Constitutional: [] Weight loss  [] Fever  [] Chills Cardiac: [] Chest pain   [] Chest pressure   [] Palpitations   [] Shortness of breath when laying flat   [] Shortness of breath at rest   [] Shortness of breath with exertion. Vascular:  [] Pain in legs with walking   [] Pain in legs at rest   [] Pain in legs when laying flat   [] Claudication   [] Pain in feet when walking  [] Pain in feet at rest  [] Pain in feet when laying flat   [] History of DVT   [] Phlebitis   [] Swelling in legs   [] Varicose veins   [] Non-healing ulcers Pulmonary:   [] Uses home oxygen   [] Productive cough   [] Hemoptysis   [] Wheeze  [] COPD   [] Asthma Neurologic:  [x] Dizziness  [] Blackouts   [] Seizures   [] History of stroke   [] History of TIA  [] Aphasia   [] Temporary blindness   [] Dysphagia   [] Weakness or numbness in arms   [] Weakness or numbness in legs Musculoskeletal:  [] Arthritis   [] Joint swelling   [] Joint pain   [] Low back pain Hematologic:  [] Easy bruising  [] Easy bleeding   [] Hypercoagulable state   [x] Anemic   Gastrointestinal:  [] Blood in stool   [] Vomiting blood  [x] Gastroesophageal reflux/heartburn   [] Abdominal pain Genitourinary:  [] Chronic kidney disease   [] Difficult urination  [] Frequent urination   [] Burning with urination   [] Hematuria Skin:  [] Rashes   [] Ulcers   [] Wounds Psychological:  [] History of anxiety   []  History of major depression.  Physical Examination  BP 130/85 (BP Location: Left Arm)   Pulse 72   Resp 17   Ht 5\' 4"  (1.626 m)   Wt 157 lb (71.2 kg)   BMI 26.95 kg/m  Gen:  WD/WN,  NAD Head: /AT, No temporalis wasting. Ear/Nose/Throat: Hearing grossly intact, nares w/o erythema or drainage Eyes: Conjunctiva clear. Sclera non-icteric Neck: Supple.  Trachea midline. No carotid bruit. Pulmonary:  Good air movement, no use of accessory muscles.  Cardiac: RRR, no JVD Vascular:  Vessel Right Left  Radial Palpable Palpable                          PT Palpable Palpable  DP Palpable Palpable   Gastrointestinal: soft, non-tender/non-distended. No guarding/reflex.  Musculoskeletal: M/S 5/5 throughout.  No deformity or atrophy. No edema. Neurologic: Sensation grossly intact in extremities.  Symmetrical.  Speech is fluent.  Psychiatric: Judgment intact, Mood & affect appropriate for pt's clinical situation. Dermatologic: No rashes or ulcers noted.  No cellulitis or open wounds.      Labs No results found for this or any previous visit (from the past 2160 hour(s)).  Radiology No results found.  Assessment/Plan  Mixed hyperlipidemia lipid control important in reducing the progression of atherosclerotic disease. Continue statin therapy   Subclavian steal syndrome Her duplex today shows 1 to 39% right ICA stenosis and 40 to 59% left ICA stenosis.  Both subclavian arteries appear triphasic and normal in both vertebral arteries are antegrade.  Doing well after revascularization.  Stent appears patent.  Recheck in 6 months.  Carotid stenosis Her duplex today shows 1 to 39% right ICA stenosis and 40 to 59% left ICA stenosis.  Both subclavian arteries appear triphasic and normal in both vertebral arteries are antegrade.  No role for intervention at this level.   Continue current medical regimen.  Recheck in 6 months.    Festus Barren, MD  06/09/2022 12:08 PM    This note was created with Dragon medical transcription system.  Any errors from dictation are purely unintentional

## 2022-06-09 NOTE — Assessment & Plan Note (Signed)
Her duplex today shows 1 to 39% right ICA stenosis and 40 to 59% left ICA stenosis.  Both subclavian arteries appear triphasic and normal in both vertebral arteries are antegrade.  No role for intervention at this level.  Continue current medical regimen.  Recheck in 6 months.

## 2022-06-09 NOTE — Assessment & Plan Note (Signed)
lipid control important in reducing the progression of atherosclerotic disease. Continue statin therapy  

## 2022-06-09 NOTE — Assessment & Plan Note (Signed)
Her duplex today shows 1 to 39% right ICA stenosis and 40 to 59% left ICA stenosis.  Both subclavian arteries appear triphasic and normal in both vertebral arteries are antegrade.  Doing well after revascularization.  Stent appears patent.  Recheck in 6 months.

## 2022-12-10 ENCOUNTER — Encounter (INDEPENDENT_AMBULATORY_CARE_PROVIDER_SITE_OTHER): Payer: Federal, State, Local not specified - PPO

## 2022-12-10 ENCOUNTER — Ambulatory Visit (INDEPENDENT_AMBULATORY_CARE_PROVIDER_SITE_OTHER): Payer: Medicare Other | Admitting: Nurse Practitioner

## 2023-01-22 ENCOUNTER — Ambulatory Visit (INDEPENDENT_AMBULATORY_CARE_PROVIDER_SITE_OTHER): Payer: Medicare Other | Admitting: Nurse Practitioner

## 2023-01-22 ENCOUNTER — Ambulatory Visit (INDEPENDENT_AMBULATORY_CARE_PROVIDER_SITE_OTHER): Payer: Medicare Other

## 2023-01-22 VITALS — BP 142/86 | HR 64 | Resp 16 | Wt 166.0 lb

## 2023-01-22 DIAGNOSIS — E782 Mixed hyperlipidemia: Secondary | ICD-10-CM

## 2023-01-22 DIAGNOSIS — G458 Other transient cerebral ischemic attacks and related syndromes: Secondary | ICD-10-CM | POA: Diagnosis not present

## 2023-01-22 DIAGNOSIS — I6523 Occlusion and stenosis of bilateral carotid arteries: Secondary | ICD-10-CM

## 2023-01-24 ENCOUNTER — Encounter (INDEPENDENT_AMBULATORY_CARE_PROVIDER_SITE_OTHER): Payer: Self-pay | Admitting: Nurse Practitioner

## 2023-01-24 NOTE — Progress Notes (Signed)
Subjective:    Patient ID: [name], female    DOB: Nov 22, 1955, 67 y.o.   MRN: [id number] Chief Complaint  Patient presents with   Follow-up    6 month carotid follow up    Patient returns today in follow up of carotid and subclavian disease.  She is doing well.  About a year ago, she underwent left subclavian stent placement for left subclavian steal syndrome.  She is doing well.  She has significant improvement in her left arm symptoms and no current vertebrobasilar symptoms.  She denies any arm or leg weakness or numbness, speech or swallowing difficulty, or temporary monocular blindness.  Her duplex today shows 1 to 39% right ICA stenosis and minimal left ICA stenosis which is a stark difference from the previous 40 to 59% left ICA stenosis.  Both subclavian arteries appear triphasic and normal in both vertebral arteries are antegrade.    Review of Systems  Neurological:  Negative for dizziness and syncope.  All other systems reviewed and are negative.      Objective:   Physical Exam Vitals reviewed.  HENT:     Head: Normocephalic.  Cardiovascular:     Rate and Rhythm: Normal rate.     Pulses: Normal pulses.  Pulmonary:     Effort: Pulmonary effort is normal.  Skin:    General: Skin is warm and dry.  Neurological:     Mental Status: She is alert and oriented to person, place, and time.  Psychiatric:        Mood and Affect: Mood normal.        Behavior: Behavior normal.        Thought Content: Thought content normal.        Judgment: Judgment normal.     BP (!) 142/86   Pulse 64   Resp 16   Wt 166 lb (75.3 kg)   BMI 28.49 kg/m   Past Medical History:  Diagnosis Date   Anemia    reportedly IDA; GIVENS performed by Dr. Elnoria Howard Oct 2009, no abnormalities   Anxiety    Carpal tunnel syndrome    Dizziness    GERD (gastroesophageal reflux disease)    Lupus 2012   S/P colonoscopy Sept 2009   normal colon, large hemorrhoids   S/P endoscopy Sept 2009    Dr. Elnoria Howard: hiatal hernia   Vertigo     Social History   Socioeconomic History   Marital status: Widowed    Spouse name: Not on file   Number of children: 0   Years of education: 14   Highest education level: Not on file  Occupational History   Occupation: Retired    Associate Professor: [employer]  Tobacco Use   Smoking status: Former    Current packs/day: 0.00    Types: Cigarettes    Quit date: 08/15/1998    Years since quitting: 24.4   Smokeless tobacco: Never   Tobacco comments:    Quit 2000  Vaping Use   Vaping status: Never Used  Substance and Sexual Activity   Alcohol use: Not Currently    Alcohol/week: 0.0 standard drinks of alcohol    Comment: occ   Drug use: No   Sexual activity: Not on file  Other Topics Concern   Not on file  Social History Narrative   Lives at home with husband.   Right-handed.   No caffeine use.   Social Drivers of Health   Financial Resource Strain: Low Risk  (07/13/2022)  Received from [facility]   Overall Financial Resource Strain (CARDIA)    Difficulty of Paying Living Expenses: Not hard at all  Food Insecurity: No Food Insecurity (07/13/2022)   Received from [facility]   Hunger Vital Sign    Worried About Running Out of Food in the Last Year: Never true    Ran Out of Food in the Last Year: Never true  Transportation Needs: No Transportation Needs (07/13/2022)   Received from [facility] - Transportation    In the past 12 months, has lack of transportation kept you from medical appointments or from getting medications?: No    Lack of Transportation (Non-Medical): No  Physical Activity: Not on file  Stress: Not on file  Social Connections: Not on file  Intimate Partner Violence: Not on file    Past Surgical History:  Procedure Laterality Date   ADENOIDECTOMY     bilateral foot surgery     CARPAL TUNNEL RELEASE Right 04/07/2016   Procedure: RIGHT CARPAL TUNNEL  RELEASE;  Surgeon: Cindee Salt, MD;  Location: [facility];  Service: Orthopedics;  Laterality: Right;  REG/FAB   CHOLECYSTECTOMY     endoscopy and colonoscopy  2009   Dr. Elnoria Howard: hiatal hernia, normal colon, large hemorrhoids   ESOPHAGOGASTRODUODENOSCOPY N/A 05/16/2021   Procedure: ESOPHAGOGASTRODUODENOSCOPY (EGD);  Surgeon: [name], DO;  Location: [facility] ENDOSCOPY;  Service: Gastroenterology;  Laterality: N/A;   GIVENS CAPSULE STUDY  Oct 2009   Dr. Elnoria Howard: normal   KNEE SURGERY Right    NISSEN FUNDOPLICATION     NOSE SURGERY     ROTATOR CUFF REPAIR     TONSILLECTOMY     UPPER EXTREMITY ANGIOGRAPHY Left 08/18/2021   Procedure: Upper Extremity Angiography;  Surgeon: Annice Needy, MD;  Location: [facility] INVASIVE CV LAB;  Service: Cardiovascular;  Laterality: Left;    Family History  Problem Relation Age of Onset   Breast cancer Sister    Anxiety disorder Mother    Diabetes Father    Cervical cancer Maternal Grandmother    Stroke Maternal Grandfather    Heart attack Paternal Grandfather    Colon cancer Neg Hx     Allergies  Allergen Reactions   Bismuth Subsalicylate Nausea And Vomiting   Cephalexin Other (See Comments)    Severe sore throat    Codeine Nausea And Vomiting   Famotidine Nausea And Vomiting       Latest Ref Rng & Units 02/11/2011    4:01 PM 02/12/2009    7:24 AM 02/15/2008   12:15 PM  CBC  WBC 4.0 - 10.5 K/uL 6.9   6.6   Hemoglobin 12.0 - 15.0 g/dL 16.1  09.6  04.5   Hematocrit 36.0 - 46.0 % 38.5   40.3   Platelets 150 - 400 K/uL 256   243       CMP     Component Value Date/Time   NA 140 01/30/2014 0000   K 4.1 01/30/2014 0000   CL 105 01/30/2014 0000   CO2 26 01/30/2014 0000   GLUCOSE 85 01/30/2014 0000   BUN 12 08/18/2021 0844   CREATININE 0.70 08/18/2021 0844   CREATININE 0.63 01/30/2014 0000   CALCIUM 9.0 01/30/2014 0000   PROT 7.0 01/30/2014 0000   ALBUMIN 4.2 01/30/2014 0000   AST 42 (H) 01/30/2014 0000   ALT 59 (H)  01/30/2014 0000   ALKPHOS 82 01/30/2014 0000   BILITOT 0.3 01/30/2014 0000  GFRNONAA >60 08/18/2021 0844   GFRNONAA >89 01/30/2014 0000     No results found.     Assessment & Plan:   1. Subclavian steal syndrome (Primary) She still has multiphasic flow in the bilateral subclavian arteries and the left subclavian artery stent appears to be patent.  No evidence of subclavian steal noted with antegrade flow in the bilateral vertebral arteries.  Will plan on having patient return in 1 year or sooner if symptoms arise.   2. Bilateral carotid artery stenosis Today the patient has normal 39% stenosis in the right ICA but very minimal on the left.  This is in contrast to previous studies but is an indication that there has not been any worsening.    3.Mixed hyperlipidemia Continue statin as ordered and reviewed, no changes at this time   Current Outpatient Medications on File Prior to Visit  Medication Sig Dispense Refill   alendronate (FOSAMAX) 70 MG tablet Take 70 mg by mouth once a week.     amphetamine-dextroamphetamine (ADDERALL) 10 MG tablet Take 10 mg by mouth daily.     ascorbic acid (VITAMIN C) 500 MG tablet Take 500 mg by mouth daily.     aspirin EC 325 MG tablet Take 325 mg by mouth daily.     aspirin-acetaminophen-caffeine (EXCEDRIN MIGRAINE) 250-250-65 MG tablet Take 1 tablet by mouth every 6 (six) hours as needed for headache.     azelastine (ASTELIN) 0.1 % nasal spray Place into both nostrils 2 (two) times daily. Use in each nostril as directed     cholecalciferol (VITAMIN D3) 25 MCG (1000 UNIT) tablet Take 1,000 Units by mouth daily.     cyclobenzaprine (FLEXERIL) 10 MG tablet Take 10 mg by mouth.     diclofenac (VOLTAREN) 75 MG EC tablet Take 75 mg by mouth as needed.      escitalopram (LEXAPRO) 20 MG tablet Take 20 mg by mouth daily.     esomeprazole (NEXIUM) 40 MG capsule Take by mouth.     ferrous sulfate 325 (65 FE) MG tablet Take 325 mg by mouth daily with  breakfast.     fluticasone (FLONASE) 50 MCG/ACT nasal spray Place into both nostrils daily.     gabapentin (NEURONTIN) 100 MG capsule Take 300 mg by mouth 2 (two) times daily.     hydroxychloroquine (PLAQUENIL) 200 MG tablet Take by mouth.     Plant Sterols and Stanols (CHOLESTOFF PLUS PO) Take by mouth daily.     Simethicone 125 MG CAPS Simethicone     vitamin B-12 (CYANOCOBALAMIN) 500 MCG tablet Take 500 mcg by mouth daily.     No current facility-administered medications on file prior to visit.    There are no Patient Instructions on file for this visit. No follow-ups on file.   Georgiana Spinner, NP

## 2023-04-26 LAB — COLOGUARD: COLOGUARD: NEGATIVE

## 2023-06-22 ENCOUNTER — Encounter (INDEPENDENT_AMBULATORY_CARE_PROVIDER_SITE_OTHER): Payer: Self-pay

## 2023-10-28 IMAGING — CT CT NECK W/ CM
5 series · 16 of 33 positions shown, 18 images · IV contrast (agent unspecified)
Comparison: None.

CLINICAL DATA: Discomfort in neck. Unilateral vocal cord paralysis
for months

EXAM:
CT NECK WITH CONTRAST
TECHNIQUE: Multidetector CT imaging of the neck was performed using the
standard protocol following the bolus administration of intravenous
contrast.

[Series 11: neck 2.0 st · axial · 0.39mm/px · z∈[-317,-207]mm · 3 of 111 slices shown]
[im 28/111  bone]
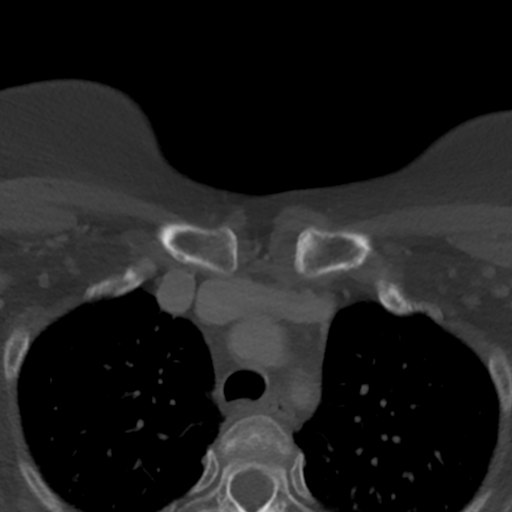
[im 56/111  bone]
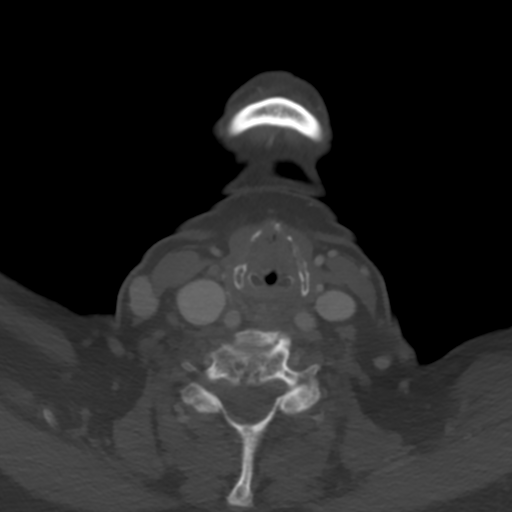
[im 83/111  bone]
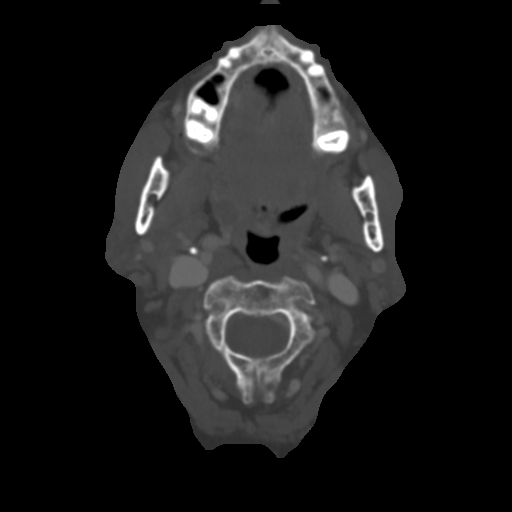

[Series 12: sag · sagittal · 0.49mm/px · 5 of 123 slices shown, 6 images]
[im 41/123  bone]
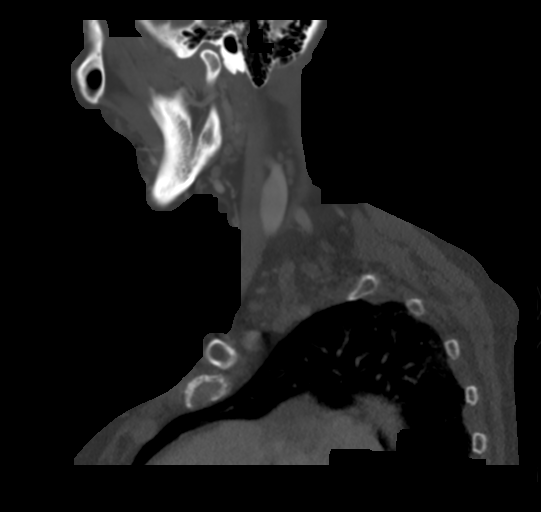
[im 51/123  bone]
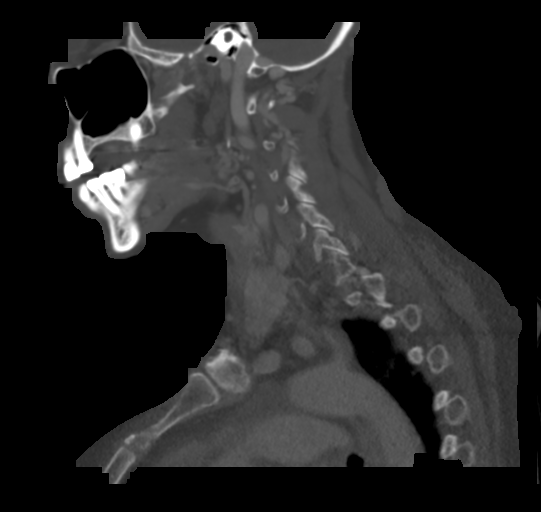
[im 62/123  soft-tissue]
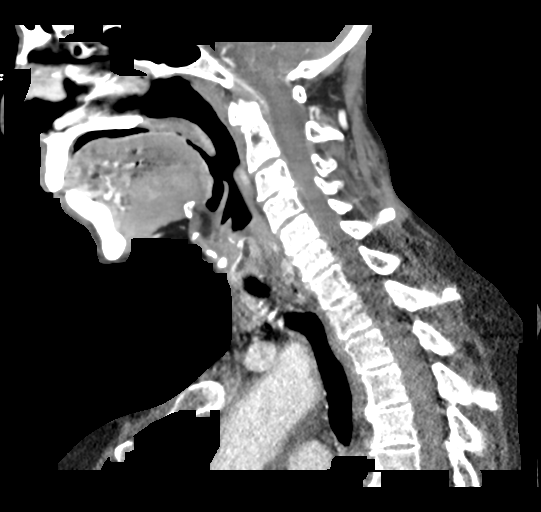
[im 62/123  bone]
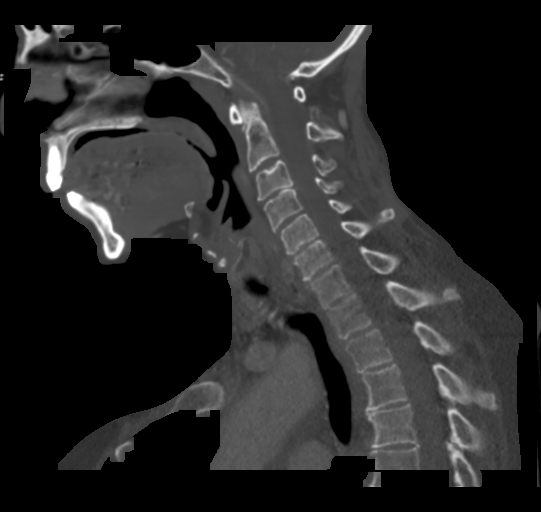
[im 72/123  bone]
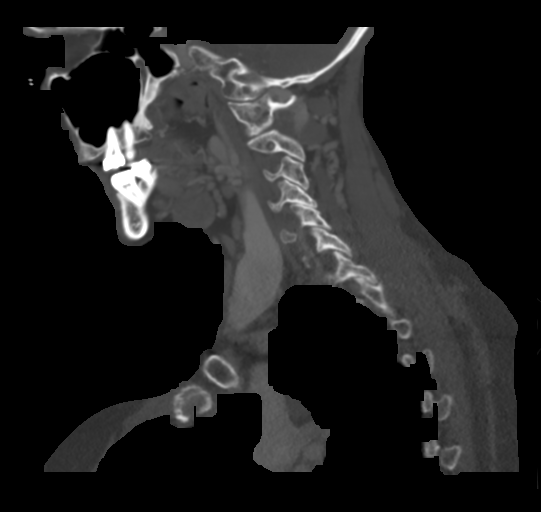
[im 82/123  bone]
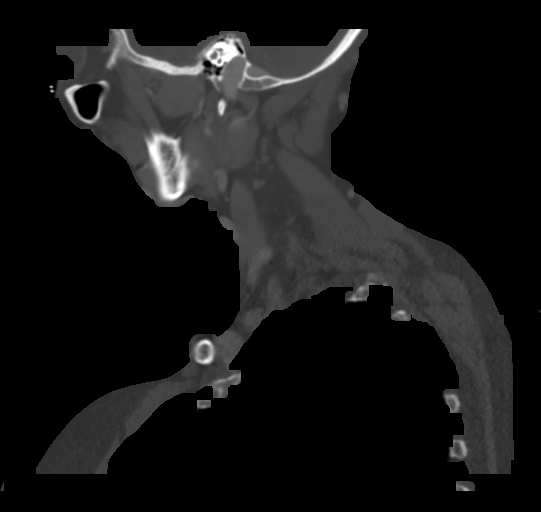

[Series 13: cor · coronal · 0.43mm/px · 3 of 115 slices shown]
[im 23/115  bone]
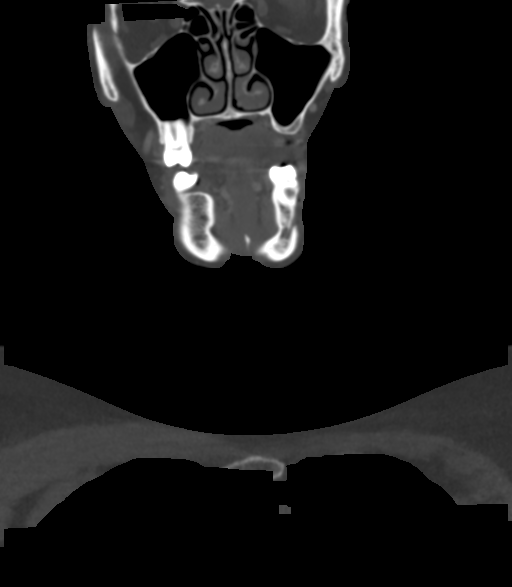
[im 46/115  bone]
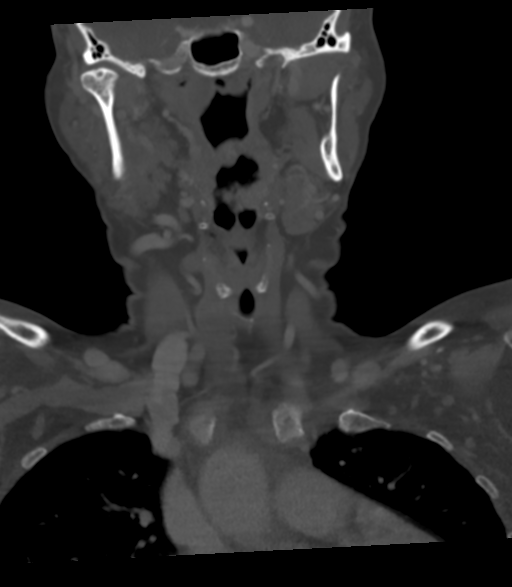
[im 69/115  bone]
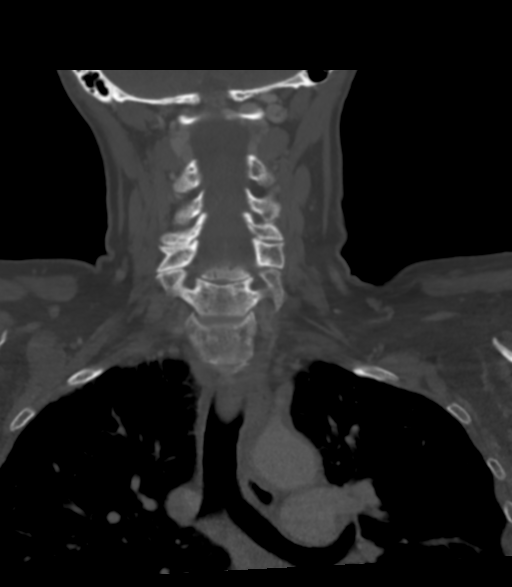

[Series 14: orthogonal axial · axial · 0.41mm/px · z∈[-341,-218]mm · 3 of 127 slices shown, 4 images]
[im 32/127  soft-tissue]
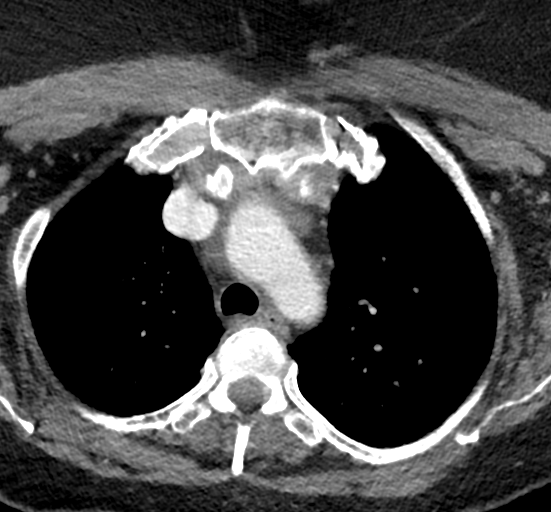
[im 32/127  bone]
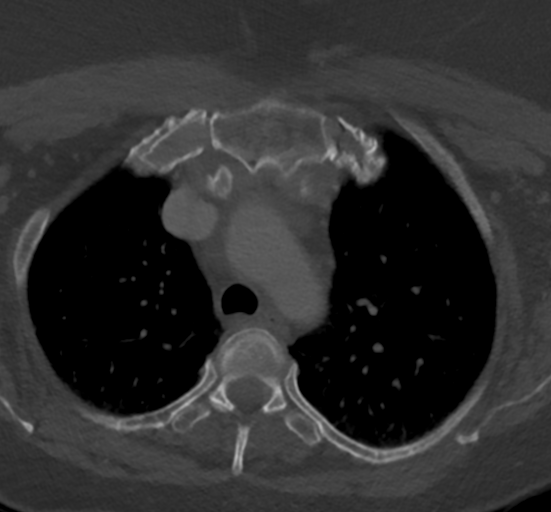
[im 64/127  bone]
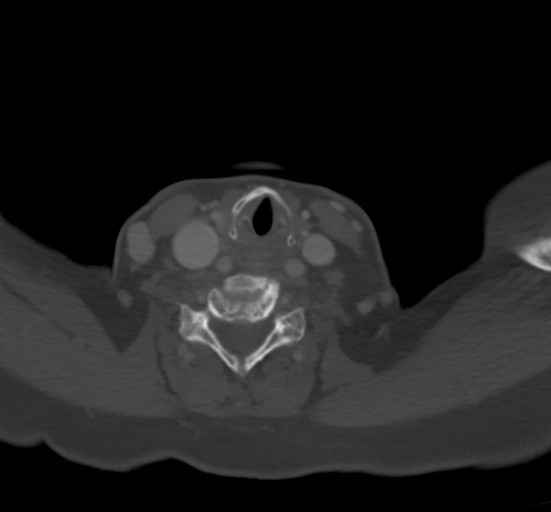
[im 95/127  bone]
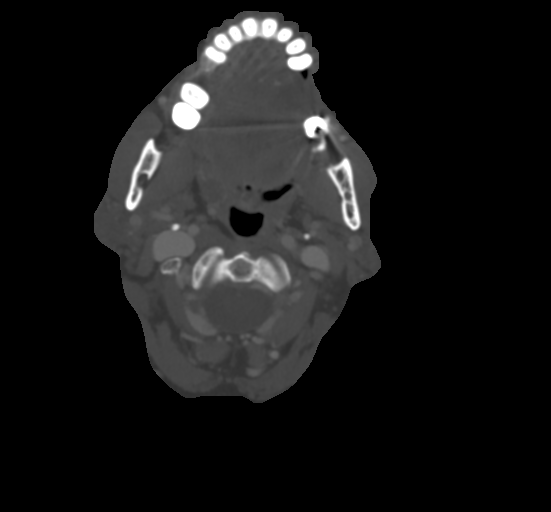

[Series 18: neck bone · axial · 0.39mm/px · z∈[-287,-219]mm · 2 of 102 slices shown]
[im 34/102  bone]
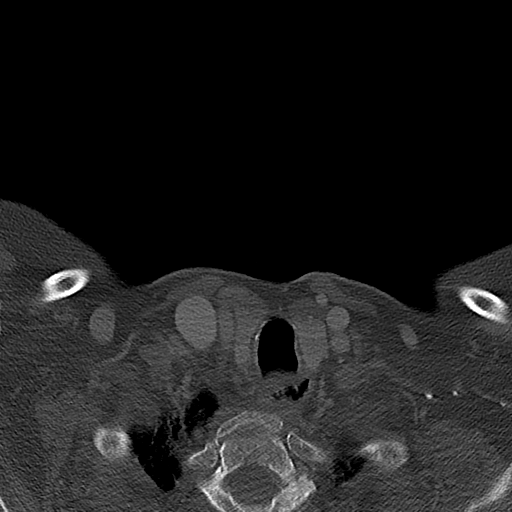
[im 68/102  bone]
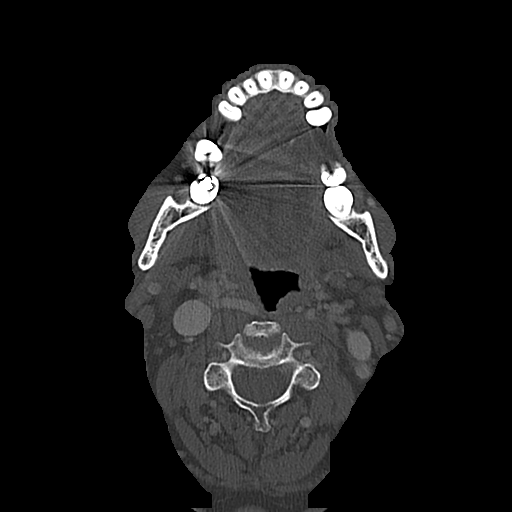

[16 of 33 positions shown; findings below may reference images not displayed]

RADIATION DOSE REDUCTION: This exam was performed according to the
departmental dose-optimization program which includes automated
exposure control, adjustment of the mA and/or kV according to
patient size and/or use of iterative reconstruction technique.

CONTRAST:  100mL CBVH4U-QYY IOPAMIDOL (CBVH4U-QYY) INJECTION 61%
FINDINGS: Pharynx and larynx: No masslike enhancement. There is low-density at
the right palatine fossa measuring 8 mm. History of vocal cord
paresis which is presumably on the right where the puriform sinus is
larger. No visible underlying laryngeal mass.

Salivary glands: No inflammation, mass, or stone.

Thyroid: Normal.

Lymph nodes: None enlarged or abnormal density.

Vascular: Partial retropharyngeal course of the carotids, especially
on the right. Low-density plaque at the proximal left subclavian
artery likely causing flow reducing stenosis.

Limited intracranial: Persistent trigeminal artery on the left.

Visualized orbits: Negative

Mastoids and visualized paranasal sinuses: Clear

Skeleton: Ordinary cervical spine degeneration. No acute or
aggressive finding

Upper chest: Reported separately.
IMPRESSION: 1. History of vocal cord paresis presumably on the right. No
associated mass or other visible cause.
2. 8 mm low-density at the right tonsillar fossa suggesting
retention cyst, please correlate with mucosal exam.
3. Atheromatous narrowing of the proximal left subclavian which
appears flow reducing.

## 2023-10-28 IMAGING — CT CT CHEST W/ CM
1 of 3 series · 15 of 32 positions shown, 19 images · IV contrast (agent unspecified)
Comparison: 04/30/2003

CLINICAL DATA: Unilateral vocal cord paralysis.

EXAM:
CT CHEST WITH CONTRAST
TECHNIQUE: Multidetector CT imaging of the chest was performed during
intravenous contrast administration.

[Series 7: chest with 1mm st · axial · 0.59mm/px · z∈[-555,-275]mm · 15 of 401 slices shown, 19 images]
[im 26/401  mediastinal]
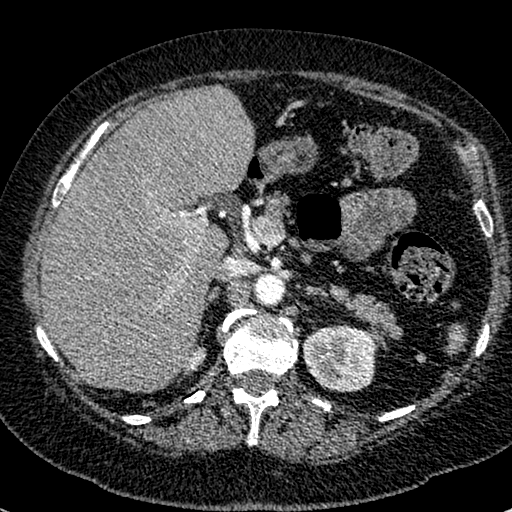
[im 26/401  lung]
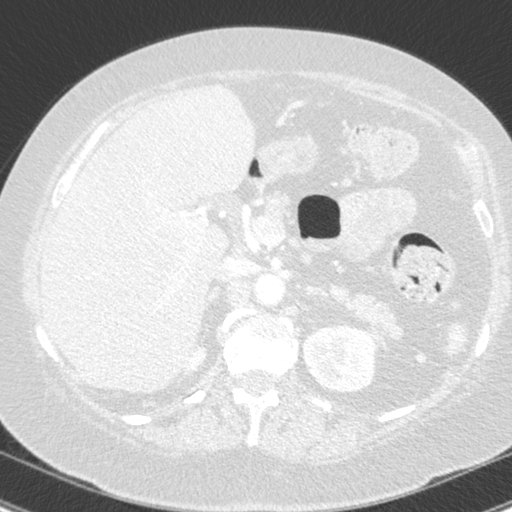
[im 51/401  lung]
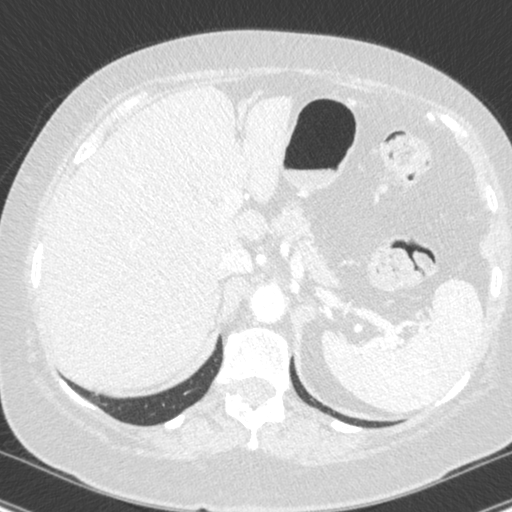
[im 101/401  lung]
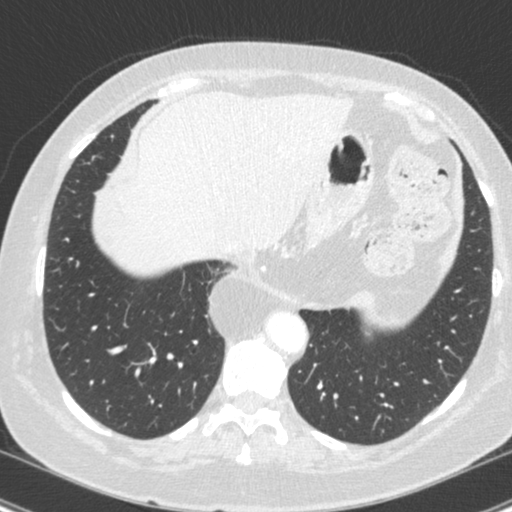
[im 126/401  lung]
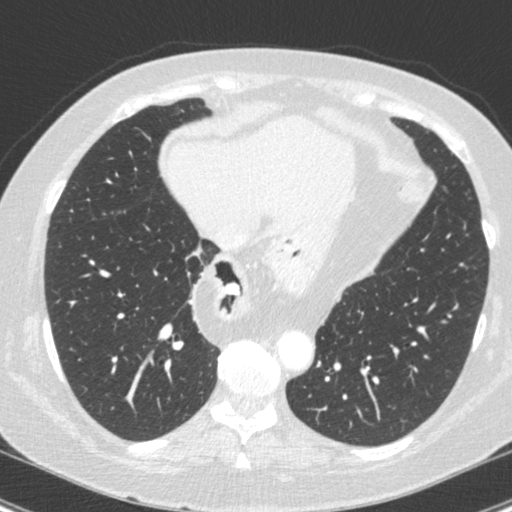
[im 134/401  mediastinal]
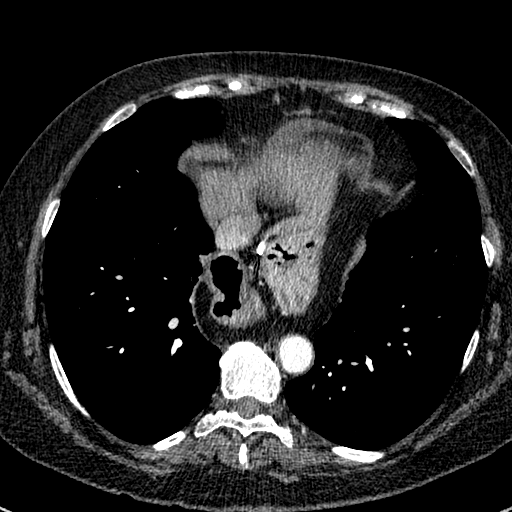
[im 134/401  lung]
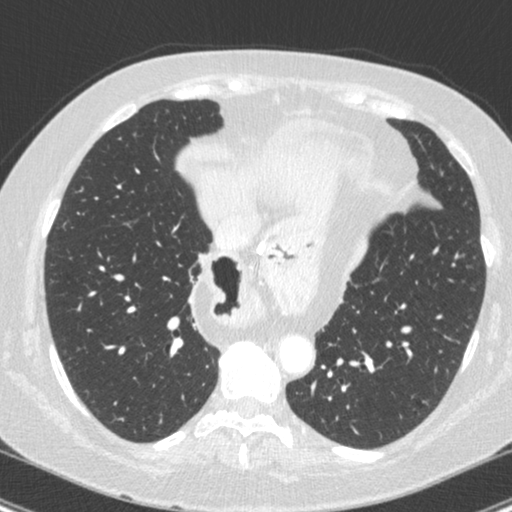
[im 151/401  lung]
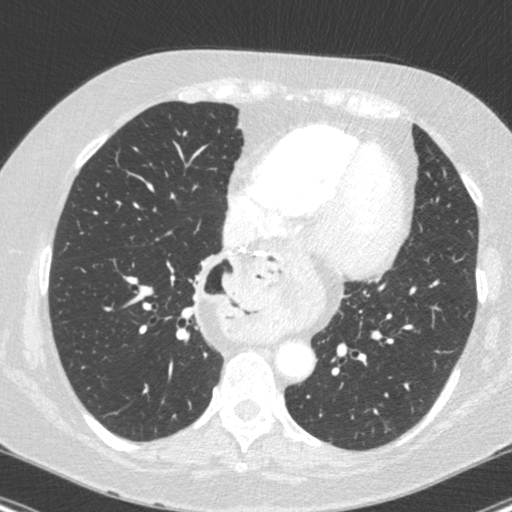
[im 176/401  lung]
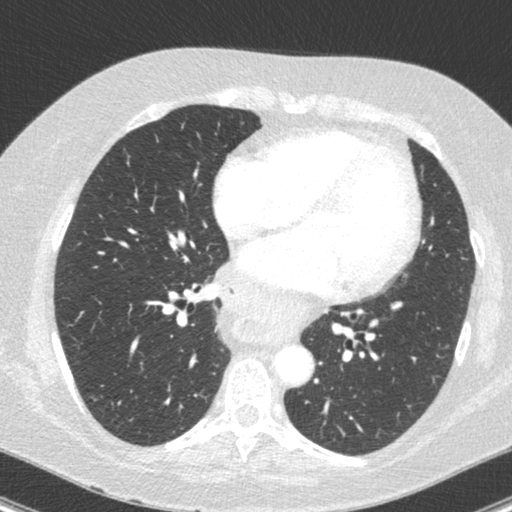
[im 226/401  lung]
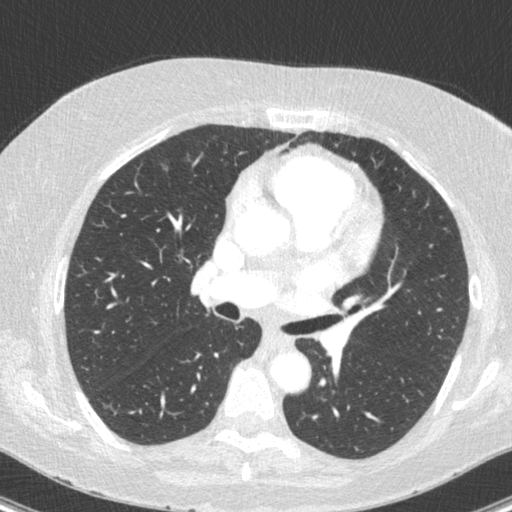
[im 251/401  mediastinal]
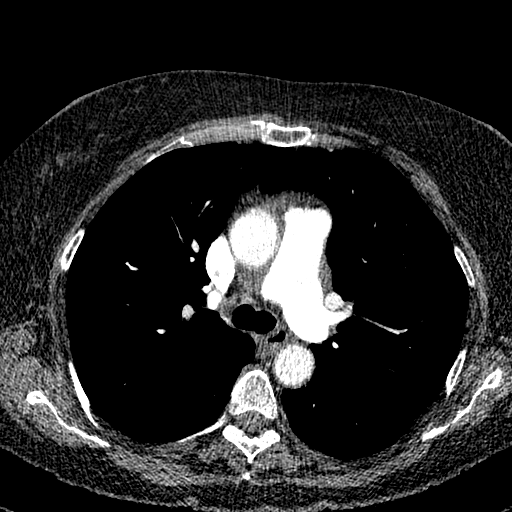
[im 251/401  lung]
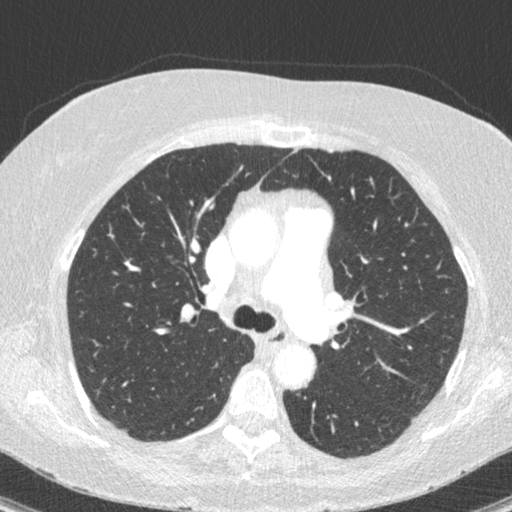
[im 254/401  lung]
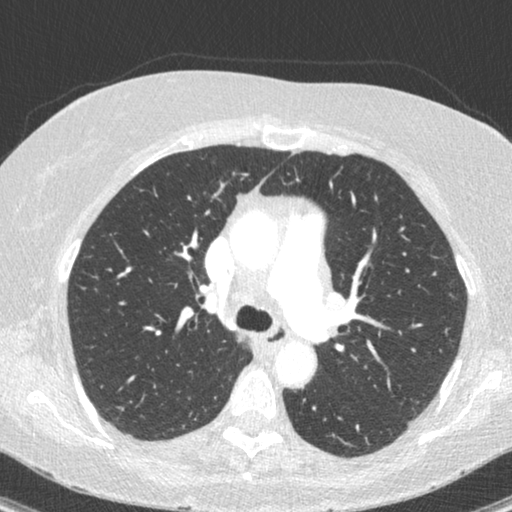
[im 267/401  lung]
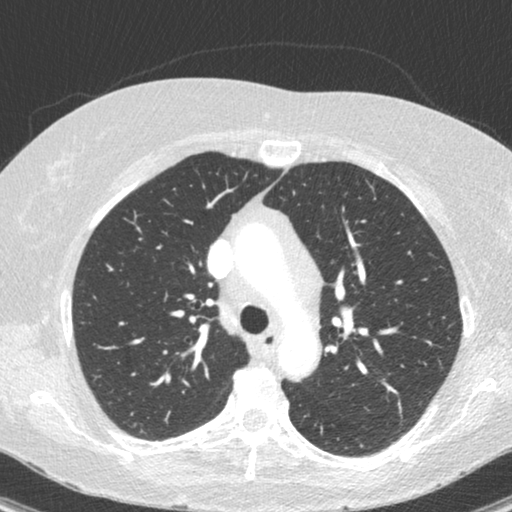
[im 276/401  lung]
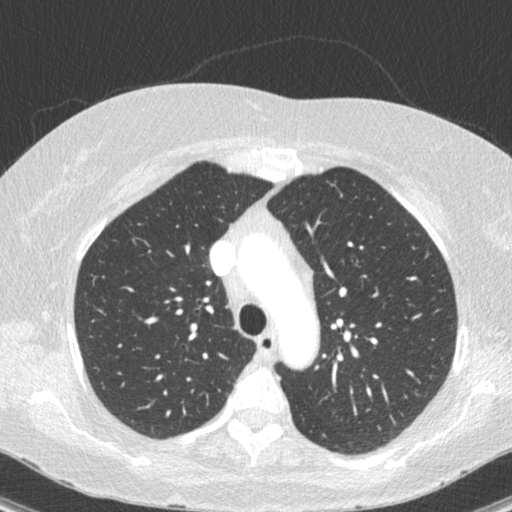
[im 326/401  mediastinal]
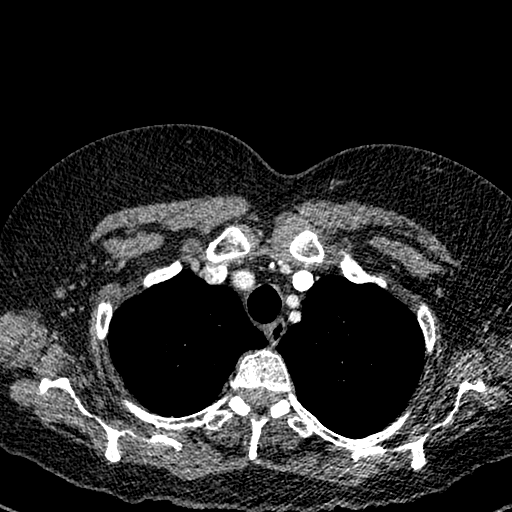
[im 326/401  lung]
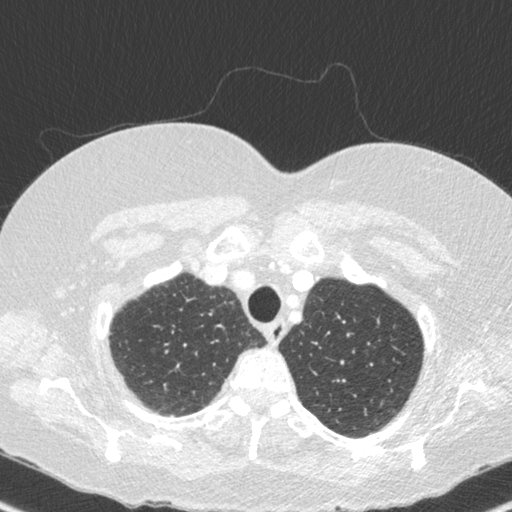
[im 351/401  lung]
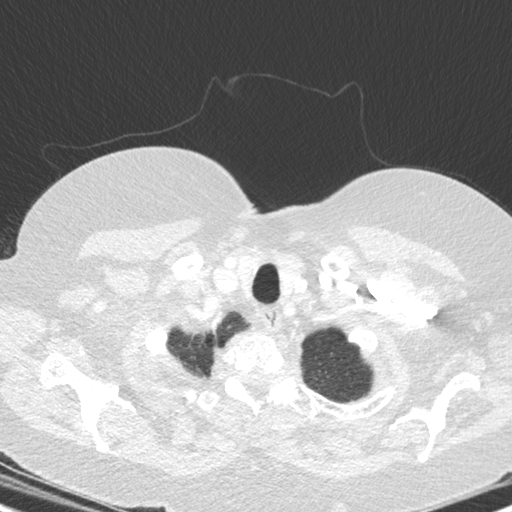
[im 376/401  lung]
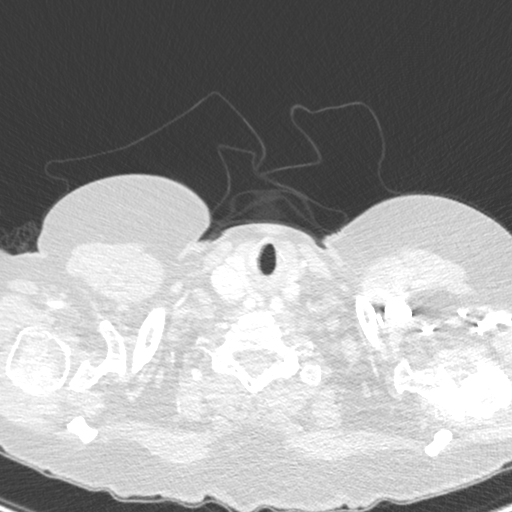

[15 of 32 positions shown; findings below may reference images not displayed]

RADIATION DOSE REDUCTION: This exam was performed according to the
departmental dose-optimization program which includes automated
exposure control, adjustment of the mA and/or kV according to
patient size and/or use of iterative reconstruction technique.

CONTRAST:  100mL XRAWOM-C33 IOPAMIDOL (XRAWOM-C33) INJECTION 61%
FINDINGS: Cardiovascular: Normal heart size. No pericardial effusion. Normally
enhancing pulmonary arteries. Low-density atheromatous plaque at the
proximal left subclavian with 70% stenosis. No subclavian aneurysm.

Mediastinum/Nodes: Negative for adenopathy or mass. Large sliding
hiatal hernia. Clips around the proximal stomach. No mass along the
course of the recurrent laryngeal nerve.

Lungs/Pleura: There is no edema, consolidation, effusion, or
pneumothorax. No suspicious pulmonary nodule

Upper Abdomen: Cholecystectomy. Mild atheromatous plaque of the
aorta.

Musculoskeletal: Ovoid mass anterior to the inferior angle of the
left scapula which measures up to 3.3 cm in length by 1 cm in
thickness. There is a composition of soft tissue density, fat, and
calcification. No visible origination from bone or muscle and no
location typical of elastofibroma. Bilateral glenohumeral
osteoarthritis.
IMPRESSION: 1. No explanation for vocal cord paresis.
2. 70% atheromatous narrowing of the proximal left subclavian.
3. Fatty and calcified soft tissue mass anterior to the inferior
angle of the left scapula, CT features suggesting fat necrosis. Was
there trauma in this location previously (indication of left
shoulder MRI in [id number], but images not available).

## 2023-11-30 NOTE — Progress Notes (Signed)
 Holter monitor report from 10/29/2023 to 11/13/2023: Predominant rhythm was sinus. Max heart rate 153, minimum 41 average 68. VE burden less than 1%.  4 beat run of VT suspect during sleep hours. SVE burden less than 1%.  1 occurrence of SVT lasting 3 beats. There was 1 patient triggered which was unassociated with any rhythm.

## 2023-12-10 NOTE — Progress Notes (Signed)
 Chief Complaint  Patient presents with  . Follow-up    Subjective  [name] is a 68 y.o. female who presents for Follow-up HPI History of Present Illness [name] is a 68 year old female with diastolic dysfunction who presents with episodes of elevated diastolic blood pressure and feeling unwell. She was referred by Dr. Tobie for evaluation of her elevated diastolic blood pressure episodes.  She has experienced two episodes of feeling extremely unwell, with significantly elevated diastolic blood pressure readings up to 138 mmHg. These episodes resolved after resting. She uses two different blood pressure cuffs, both showing similar elevated readings during these episodes.  She has a history of diastolic dysfunction, noted as grade two on a recent echocardiogram. A recent echocardiogram showed grade two diastolic dysfunction. On a heart monitor, she was told there were a couple of brief episodes of increased heart rate.  She experiences symptoms suggestive of sleep apnea, such as waking up feeling suffocated and unable to breathe. She uses a mouthpiece to aid her breathing at night but cannot tolerate a CPAP machine. Despite these interventions, she continues to have occasional episodes of waking up unable to breathe.  She has a long-standing history of GERD since she was 68 years old. She notes that these symptoms have been more prominent over the last year to year and a half.  She is very active, often engaging in physical activities such as unloading horse feed and gardening. However, she sometimes experiences shortness of breath and fatigue with minimal exertion, such as feeding her horses. She recalls similar symptoms during menopause, which she attributed to hormonal changes, but notes that these symptoms persist despite hormonal stabilization.  Review of Systems  Patient Active Problem List  Diagnosis  . Mixed hyperlipidemia  . Gastroesophageal reflux disease without  esophagitis  . Situational mixed anxiety and depressive disorder  . History of lupus  . Sjogren's syndrome (HHS-HCC)  . Myalgia  . Chronic fatigue  . Encounter for long-term (current) use of high-risk medication  . Hamstring injury  . Abdominal bloating  . Carotid stenosis  . Subclavian steal syndrome  . Acquired scoliosis  . Backache  . Degeneration of lumbar intervertebral disc  . Lumbar spondylosis  . Lumbago with sciatica  . Spinal stenosis of lumbar region    Outpatient Medications Prior to Visit  Medication Sig Dispense Refill  . ascorbate calcium  (VITAMIN C ORAL) Take 1 tablet by mouth every morning    . aspirin  81 MG EC tablet Take 81 mg by mouth once daily    . aspirin /acetaminophen /caffeine (EXCEDRIN MIGRAINE ORAL) Take by mouth as needed    . cholecalciferol (VITAMIN D3) 1000 unit capsule Take 1,000 Units by mouth once daily    . cyanocobalamin , vitamin B-12, (VITAMIN B-12 ORAL) Take 1,000 mcg by mouth every evening    . dextroamphetamine-amphetamine (ADDERALL XR) 10 MG XR capsule Take 1 capsule (10 mg total) by mouth every morning for 30 days 30 capsule 0  . diclofenac (VOLTAREN) 75 MG EC tablet Take 1 tablet (75 mg total) by mouth 2 (two) times daily with meals 60 tablet 0  . esomeprazole (NEXIUM) 40 MG DR capsule Take 40 mg by mouth 2 (two) times daily    . ferrous sulfate (IRON ORAL) Take by mouth every morning    . gabapentin (NEURONTIN) 100 MG capsule Take 3 capsules (300 mg total) by mouth 2 (two) times daily 540 capsule 1  . Herbal Supplement Take by mouth once daily Herbal Name: Curamin    .  hydroxychloroquine (PLAQUENIL) 200 mg tablet Take 1 tablet (200 mg total) by mouth once daily 90 tablet 1  . MULTIVITAMIN ORAL Take by mouth    . NON FORMULARY Take 2 tablets by mouth 2 (two) times daily All natural fluid pill    . phytosterol/pantethine (CHOLESTOFF COMPLETE ORAL) Take by mouth once daily    . simethicone  125 mg Cap once daily as needed    . tiZANidine  (ZANAFLEX) 4 MG tablet Take 1 tablet (4 mg total) by mouth 3 (three) times daily as needed 90 tablet 2  . TURMERIC ORAL Take by mouth once daily    . aspirin  325 MG EC tablet Take 1 tablet (325 mg total) by mouth once daily (Patient not taking: Reported on 10/29/2023) 30 tablet 11  . cyanocobalamin  (VITAMIN B12) 1,000 mcg/mL injection Inject 1 mL (1,000 mcg total) into the muscle once a week For 4 weeks, then once a month for 4 months (Patient not taking: Reported on 12/10/2023) 8 mL 0  . fluticasone propionate (FLONASE) 50 mcg/actuation nasal spray Place into one nostril (Patient not taking: Reported on 12/10/2023)    . HISTIDINE, L-HISTIDINE, ORAL Take 1 teaspoon by mouth once daily (Patient not taking: Reported on 12/10/2023)    . triamcinolone 0.1 % cream Apply topically 2 (two) times daily 30 g 0  . varenicline (TYRVAYA) 0.03 mg/spray sprm Place 1 spray into both nostrils 2 (two) times daily (Patient not taking: Reported on 10/06/2023) 8.4 mL 11   No facility-administered medications prior to visit.      Objective  Vitals:   12/10/23 0937  BP: 120/80  Pulse: 67  SpO2: 98%  Weight: 76.2 kg (168 lb)  Height: 162.6 cm (5' 4)  PainSc: 0-No pain    Body mass index is 28.84 kg/m.  Home Vitals:     Physical Exam Physical Exam CARDIOVASCULAR: Heart normal, EKG normal, blood pressure normal, heart rate normal.     Constitutional: alert, in NAD, and communicates well Eye exam: pupils equal and reactive, extraocular eye movements intact. Neck: supple, no thyroid  enlargement or cervical adenopathy, and no bruits heard Respiratory: clear to auscultation, without rales or wheezes  Cardiovascular: regular rate and rhythm and without murmurs, rubs or gallops Lower extremities: no lower extremity edema Skin ankles/feet: warm, good capillary refill and no ulcerations or lesions noted Neurological: sensorimotor grossly intact and normal muscle tone  Results DIAGNOSTIC EKG: Normal  (10/29/2023)   DIAGNOSTIC Echocardiogram: Grade 2 diastolic dysfunction EKG: Normal (11/06/2023)  Holter monitor report from 10/29/2023 to 11/13/2023: Predominant rhythm was sinus. Max heart rate 153, minimum 41 average 68. VE burden less than 1%.  4 beat run of VT suspect during sleep hours. SVE burden less than 1%.  1 occurrence of SVT lasting 3 beats. There was 1 patient triggered which was unassociated with any rhythm.    Assessment/Plan:   Assessment & Plan Episodes of elevated blood pressure with symptoms Reports two episodes of significantly elevated diastolic blood pressure (138-150 mmHg) with associated symptoms of feeling unwell. Blood pressure readings are inconsistent with clinical findings, as clinic readings have never exceeded 80 mmHg diastolic. Symptoms resolve spontaneously after rest. Differential includes possible measurement error or an underlying condition not captured during routine monitoring. - Advised to go to the emergency room if another episode occurs to capture real-time data and perform further testing.  Diastolic dysfunction of heart Echocardiogram shows grade 2 diastolic dysfunction, indicating mild stiffness of the heart. No clinical symptoms of heart failure such as  fluid retention or significant dyspnea.   Palpitations and episodic tachycardia Reports palpitations and episodes of tachycardia, particularly at night, likely related to untreated sleep apnea. Heart monitor showed brief episodes of rapid heart rate but no significant arrhythmias. Symptoms may be exacerbated by sleep apnea and GERD. - Recommended follow-up with neurology for sleep apnea management. - Ordered stress test to evaluate for potential cardiac causes of symptoms.  Dyspnea on exertion Experiences dyspnea on exertion, particularly after physical activity such as walking or feeding horses. Symptoms may be related to cardiac issues or sleep apnea. Hormonal changes in the past may have  contributed to symptoms, but current management has improved some symptoms. - Ordered stress test to assess for potential cardiac causes of dyspnea. Diagnoses and all orders for this visit:  Dizziness and giddiness -     NM myocardial perfusion SPECT multiple (stress and rest); Future -     ECG stress test only; Future  Abnormal EKG -     NM myocardial perfusion SPECT multiple (stress and rest); Future -     ECG stress test only; Future  Sjogren's syndrome, with unspecified organ involvement (HHS-HCC) -     NM myocardial perfusion SPECT multiple (stress and rest); Future -     ECG stress test only; Future  Mixed hyperlipidemia -     NM myocardial perfusion SPECT multiple (stress and rest); Future -     ECG stress test only; Future  Irregular heart rate -     NM myocardial perfusion SPECT multiple (stress and rest); Future -     ECG stress test only; Future    Return in about 6 weeks (around 01/21/2024).     Future Appointments     Date/Time Provider Department Center Visit Type   12/21/2023 8:30 AM [facility] WST CARD NM [facility] C NM CLINIC STRESS TEST   01/19/2024 9:45 AM Custovic, Annalee, DO [facility] C FOLLOW UP   01/24/2024 9:30 AM Tobie Lady Plumb, MD [facility] C RHEUM RETURN VISIT   02/08/2024 10:40 AM (Arrive by 10:25 AM) Mullur, Jyotsna, MD [facility] DAAAC-A/I RETURN VISIT   03/29/2024 9:30 AM [facility] WEST LAB [facility] C LAB   04/05/2024 10:00 AM Marikay Eva Hausen, PA [facility] C PHYSICAL   08/15/2024 9:30 AM Caffaro, Kaitlin, PA [facility] [place] C RETURN VISIT       There are no Patient Instructions on file for this visit.  An after visit summary was provided for the patient either in written format (printed) or through [facility].  This note has been created using automated tools and reviewed for accuracy by [facility].

## 2023-12-17 ENCOUNTER — Other Ambulatory Visit: Payer: Self-pay

## 2023-12-17 ENCOUNTER — Emergency Department
Admission: EM | Admit: 2023-12-17 | Discharge: 2023-12-17 | Disposition: A | Discharge status: Home / Self Care | Attending: Emergency Medicine | Admitting: Emergency Medicine

## 2023-12-17 DIAGNOSIS — I10 Essential (primary) hypertension: Secondary | ICD-10-CM | POA: Diagnosis present

## 2023-12-17 LAB — BASIC METABOLIC PANEL WITH GFR
Anion gap: 10 (ref 5–15)
BUN: 11 mg/dL (ref 8–23)
CO2: 27 mmol/L (ref 22–32)
Calcium: 9.4 mg/dL (ref 8.9–10.3)
Chloride: 106 mmol/L (ref 98–111)
Creatinine, Ser: 0.57 mg/dL (ref 0.44–1.00)
GFR, Estimated: >60 mL/min (ref >60)
Glucose, Bld: 101 mg/dL — ABNORMAL HIGH (ref 70–99)
Potassium: 3.8 mmol/L (ref 3.5–5.1)
Sodium: 143 mmol/L (ref 135–145)

## 2023-12-17 LAB — CBC
HCT: 43.4 % (ref 36.0–46.0)
Hemoglobin: 14.1 g/dL (ref 12.0–15.0)
MCH: 31 pg (ref 26.0–34.0)
MCHC: 32.5 g/dL (ref 30.0–36.0)
MCV: 95.4 fL (ref 80.0–100.0)
Platelets: 228 K/uL (ref 150–400)
RBC: 4.55 MIL/uL (ref 3.87–5.11)
RDW: 14.6 % (ref 11.5–15.5)
WBC: 4.9 K/uL (ref 4.0–10.5)
nRBC: 0 % (ref 0.0–0.2)

## 2023-12-17 LAB — TROPONIN T, HIGH SENSITIVITY: Troponin T High Sensitivity: <15 ng/L (ref 0–19)

## 2023-12-17 MED ORDER — METOPROLOL TARTRATE 25 MG PO TABS: 12.5000 mg | ORAL_TABLET | Freq: Two times a day (BID) | ORAL | 0 refills | Status: AC

## 2023-12-17 MED ORDER — LOSARTAN POTASSIUM 50 MG PO TABS: 50.0000 mg | ORAL_TABLET | Freq: Every day | ORAL | 0 refills | Status: AC

## 2023-12-17 MED ORDER — FUROSEMIDE 10 MG/ML IJ SOLN
40.0000 mg | Freq: Once | INTRAMUSCULAR | Status: AC
  Administered 2023-12-17: 40 mg via INTRAVENOUS
  Filled 2023-12-17: qty 4

## 2023-12-17 MED ORDER — FUROSEMIDE 20 MG PO TABS: 20.0000 mg | ORAL_TABLET | Freq: Every day | ORAL | 0 refills | Status: AC

## 2023-12-17 NOTE — ED Triage Notes (Signed)
 Pt to ED via POV for c/o hypertension. Pt not on BP meds. Pt states BP has elevated for the past week or more. Pt states stress test scheduled Tuesday.

## 2023-12-17 NOTE — ED Provider Notes (Signed)
 [facility] Provider Note    Event Date/Time   First MD Initiated Contact with Patient 12/17/23 1116     (approximate)   History   Hypertension   HPI  [name] is a 68 y.o. female with a history of diastolic dysfunction who presents with elevated blood pressure readings over the last several days.  The patient reports systolic readings as high as the 180s and diastolic readings as high as the 130s.  She just got a new blood pressure cuff.  She reports some mild chest discomfort over the last several days but no active chest pain.  She denies feeling dizzy or lightheaded.  She has no headache.  She denies any difficulty breathing.  She just saw her cardiologist recently for this although her blood pressure has been generally normal at the office so she has not been started on anything.  I reviewed the past medical records.  The patient was seen by cardiology on 11/7.  She reported feeling unwell at that time with very elevated diastolic blood pressures.  However her clinic blood pressures were generally stable.   Physical Exam   Triage Vital Signs: ED Triage Vitals  Encounter Vitals Group     BP 12/17/23 0948 (!) 178/114     Girls Systolic BP Percentile --      Girls Diastolic BP Percentile --      Boys Systolic BP Percentile --      Boys Diastolic BP Percentile --      Pulse Rate 12/17/23 0948 72     Resp 12/17/23 0948 18     Temp 12/17/23 0948 98.4 F (36.9 C)     Temp Source 12/17/23 0948 Oral     SpO2 12/17/23 0948 100 %     Weight 12/17/23 0951 168 lb (76.2 kg)     Height 12/17/23 0951 5' 4 (1.626 m)     Head Circumference --      Peak Flow --      Pain Score 12/17/23 0948 0     Pain Loc --      Pain Education --      Exclude from Growth Chart --     Most recent vital signs: Vitals:   12/17/23 1226 12/17/23 1329  BP: (!) 178/99 (!) 176/92  Pulse: (!) 56 67  Resp: 20 18  Temp:    SpO2: 100% 100%     General: Awake, no  distress.  CV:  Good peripheral perfusion.  Normal heart sounds. Resp:  Normal effort.  Lungs CTAB. Abd:  No distention.  Other:  No peripheral edema.   ED Results / Procedures / Treatments   Labs (all labs ordered are listed, but only abnormal results are displayed) Labs Reviewed  BASIC METABOLIC PANEL WITH GFR - Abnormal; Notable for the following components:      Result Value   Glucose, Bld 101 (*)    All other components within normal limits  CBC  TROPONIN T, HIGH SENSITIVITY     EKG  ED ECG REPORT I, Waylon Cassis, the attending physician, personally viewed and interpreted this ECG.  Date: 12/17/2023 EKG Time: 1140 Rate: 62 Rhythm: normal sinus rhythm (incorrectly read by machine as undetermined rhythm) QRS Axis: normal Intervals: normal ST/T Wave abnormalities: normal Narrative Interpretation: no evidence of acute ischemia    RADIOLOGY    PROCEDURES:  Critical Care performed: No  Procedures   MEDICATIONS ORDERED IN ED: Medications  furosemide (LASIX) injection 40 mg (  40 mg Intravenous Given 12/17/23 1223)     IMPRESSION / MDM / ASSESSMENT AND PLAN / ED COURSE  I reviewed the triage vital signs and the nursing notes.  68 year old female with PMH as noted above presents with elevated blood pressure readings at home with some mild chest discomfort over at least the last several days.  She was recently evaluated for hypertension and diastolic heart failure by her cardiologist but has not yet been started on antihypertensives.  Here her blood pressure is ranging from the 170s to 190s systolic and as high as the 110s diastolic.  Physical exam is unremarkable for other acute findings.  EKG is nonischemic.  BMP and CBC show no acute findings.   Differential diagnosis includes, but is not limited to, hypertension, diastolic dysfunction.  I have added on a troponin and will contact the patient's cardiologist Dr. Custovic to discuss further management of her  blood pressure.  Patient's presentation is most consistent with acute complicated illness / injury requiring diagnostic workup.  The patient is on the cardiac monitor to evaluate for evidence of arrhythmia and/or significant heart rate changes.   ----------------------------------------- 1:25 PM on 12/17/2023 -----------------------------------------  I discussed the case with the PA working with Dr. Dewane who recommended starting the patient on the low-dose of metoprolol, losartan, and Lasix, as well as a dose of IV Lasix in the ED.  These have been given.  The troponin is negative.  At this time, the patient is stable for discharge home.  I gave her strict return precautions, and she expressed understanding.  She will follow-up with cardiology.   FINAL CLINICAL IMPRESSION(S) / ED DIAGNOSES   Final diagnoses:  Hypertension, unspecified type     Rx / DC Orders   ED Discharge Orders          Ordered    metoprolol tartrate (LOPRESSOR) 25 MG tablet  2 times daily        12/17/23 1323    losartan (COZAAR) 50 MG tablet  Daily        12/17/23 1323    furosemide (LASIX) 20 MG tablet  Daily        12/17/23 1323             Note:  This document was prepared using Dragon voice recognition software and may include unintentional dictation errors.GLENWOOD [name], MD 12/17/23 [id number]

## 2023-12-17 NOTE — Discharge Instructions (Signed)
 Start taking the 3 medications prescribed today.  Follow-up for the stress test on Tuesday as planned and then with your cardiologist as instructed.  Keep a log of your blood pressures at least once or twice daily for the next 1 to 2 weeks.  Return to the ER for new, worsening, or persistent severe chest discomfort, elevated blood pressure readings especially over 180 on the top number or 120 on the bottom number, or any other new or worsening symptoms that concern you.

## 2024-01-28 ENCOUNTER — Ambulatory Visit (INDEPENDENT_AMBULATORY_CARE_PROVIDER_SITE_OTHER): Payer: Medicare Other | Admitting: Vascular Surgery

## 2024-01-28 ENCOUNTER — Encounter (INDEPENDENT_AMBULATORY_CARE_PROVIDER_SITE_OTHER): Payer: Federal, State, Local not specified - PPO

## 2024-03-14 ENCOUNTER — Encounter (INDEPENDENT_AMBULATORY_CARE_PROVIDER_SITE_OTHER)

## 2024-03-14 ENCOUNTER — Ambulatory Visit (INDEPENDENT_AMBULATORY_CARE_PROVIDER_SITE_OTHER): Admitting: Vascular Surgery

## 2024-04-18 ENCOUNTER — Encounter (INDEPENDENT_AMBULATORY_CARE_PROVIDER_SITE_OTHER)

## 2024-04-18 ENCOUNTER — Ambulatory Visit (INDEPENDENT_AMBULATORY_CARE_PROVIDER_SITE_OTHER): Admitting: Vascular Surgery
